# Patient Record
Sex: Female | Born: 1975 | Hispanic: Yes | Marital: Married | Smoking: Never smoker
Health system: Southern US, Community
[De-identification: ages and names within clinical notes are randomized; demographics above are authoritative.]

## PROBLEM LIST (undated history)

## (undated) DIAGNOSIS — Q400 Congenital hypertrophic pyloric stenosis: Secondary | ICD-10-CM

## (undated) DIAGNOSIS — N750 Cyst of Bartholin's gland: Secondary | ICD-10-CM

## (undated) DIAGNOSIS — O09529 Supervision of elderly multigravida, unspecified trimester: Secondary | ICD-10-CM

## (undated) DIAGNOSIS — Z8719 Personal history of other diseases of the digestive system: Secondary | ICD-10-CM

## (undated) HISTORY — DX: Supervision of elderly multigravida, unspecified trimester: O09.529

## (undated) HISTORY — PX: PYLOROMYOTOMY: SUR1063

---

## 2004-07-28 ENCOUNTER — Other Ambulatory Visit: Admission: RE | Admit: 2004-07-28 | Discharge: 2004-07-28 | Payer: Self-pay | Admitting: Obstetrics and Gynecology

## 2005-09-12 ENCOUNTER — Other Ambulatory Visit: Admission: RE | Admit: 2005-09-12 | Discharge: 2005-09-12 | Payer: Self-pay | Admitting: Obstetrics and Gynecology

## 2006-10-03 ENCOUNTER — Other Ambulatory Visit: Admission: RE | Admit: 2006-10-03 | Discharge: 2006-10-03 | Payer: Self-pay | Admitting: Obstetrics and Gynecology

## 2007-12-26 ENCOUNTER — Other Ambulatory Visit: Admission: RE | Admit: 2007-12-26 | Discharge: 2007-12-26 | Payer: Self-pay | Admitting: Obstetrics and Gynecology

## 2008-12-22 ENCOUNTER — Other Ambulatory Visit: Admission: RE | Admit: 2008-12-22 | Discharge: 2008-12-22 | Payer: Self-pay | Admitting: Obstetrics and Gynecology

## 2010-02-21 ENCOUNTER — Inpatient Hospital Stay (HOSPITAL_COMMUNITY): Admission: AD | Admit: 2010-02-21 | Discharge: 2010-02-23 | Payer: Self-pay | Admitting: Obstetrics and Gynecology

## 2010-09-25 ENCOUNTER — Other Ambulatory Visit: Admission: RE | Admit: 2010-09-25 | Discharge: 2010-09-25 | Payer: Self-pay | Admitting: Obstetrics and Gynecology

## 2011-03-12 LAB — CBC
MCHC: 34.5 g/dL (ref 30.0–36.0)
Platelets: 247 10*3/uL (ref 150–400)
RDW: 14 % (ref 11.5–15.5)

## 2011-03-12 LAB — RPR: RPR Ser Ql: NONREACTIVE

## 2013-02-25 ENCOUNTER — Other Ambulatory Visit: Payer: Self-pay | Admitting: Obstetrics and Gynecology

## 2013-07-28 ENCOUNTER — Ambulatory Visit: Payer: Self-pay | Admitting: Orthopedic Surgery

## 2013-07-30 ENCOUNTER — Ambulatory Visit (INDEPENDENT_AMBULATORY_CARE_PROVIDER_SITE_OTHER): Payer: BC Managed Care – PPO | Admitting: Orthopedic Surgery

## 2013-07-30 ENCOUNTER — Encounter: Payer: Self-pay | Admitting: Orthopedic Surgery

## 2013-07-30 ENCOUNTER — Ambulatory Visit (INDEPENDENT_AMBULATORY_CARE_PROVIDER_SITE_OTHER): Payer: BC Managed Care – PPO

## 2013-07-30 VITALS — BP 112/82 | Ht 61.5 in | Wt 163.0 lb

## 2013-07-30 DIAGNOSIS — M549 Dorsalgia, unspecified: Secondary | ICD-10-CM
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
## 2013-07-30 NOTE — Patient Instructions (Addendum)
Start PHYS THERAPY   Take ibuprofen as needed

## 2013-08-06 ENCOUNTER — Ambulatory Visit (HOSPITAL_COMMUNITY)
Admission: RE | Admit: 2013-08-06 | Discharge: 2013-08-06 | Disposition: A | Discharge status: Home / Self Care | Payer: BC Managed Care – PPO | Source: Ambulatory Visit | Attending: Orthopedic Surgery | Admitting: Orthopedic Surgery

## 2013-08-06 DIAGNOSIS — M62838 Other muscle spasm: Secondary | ICD-10-CM | POA: Insufficient documentation

## 2013-08-06 DIAGNOSIS — M545 Low back pain, unspecified: Secondary | ICD-10-CM | POA: Insufficient documentation

## 2013-08-06 DIAGNOSIS — IMO0001 Reserved for inherently not codable concepts without codable children: Secondary | ICD-10-CM | POA: Insufficient documentation
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
## 2013-08-10 ENCOUNTER — Ambulatory Visit (HOSPITAL_COMMUNITY)
Admission: RE | Admit: 2013-08-10 | Discharge: 2013-08-10 | Disposition: A | Discharge status: Home / Self Care | Payer: BC Managed Care – PPO | Source: Ambulatory Visit | Attending: Orthopedic Surgery | Admitting: Orthopedic Surgery

## 2013-08-10 NOTE — Progress Notes (Signed)
Physical Therapy Treatment Patient Details  Name: LAVERE SHINSKY MRN: [id number] Date of Birth: [birth year]  Today's Date: 08/10/2013 Time: 0850-0932 PT Time Calculation (min): 42 min Charges: Manual: [id number] TE: 409-811 Estim/Heat: 922-932 Visit#: 2 of 8  Re-eval: 09/05/13    Authorization:    Authorization Time Period:    Authorization Visit#:   of     Subjective: Symptoms/Limitations Symptoms: pt reports that she has completed some of the stretchs   Precautions/Restrictions     Exercise/Treatments Prone Exercises Shoulder Extension: 5 reps (palms down) Upper Extremity Flexion with Stabilization: Flexion;5 reps Other Prone Exercise: Prone on Elbows: Serratus anterior x10 reps Other Prone Exercise: Shoulder abduction x5 reps Stretches Quadruped Mid Back Stretch: 2 reps;20 seconds  Modalities Modalities: Moist Heat;Electrical Stimulation Manual Therapy Manual Therapy: Joint mobilization Joint Mobilization: Grade I-III: Rt transverse process and rib of T7-12 and lumbar spinous process  Moist Heat Therapy Number Minutes Moist Heat: 10 Minutes (back) Museum/gallery exhibitions officer Stimulation Location: thoracic spine Electrical Stimulation Action: IFES x10 minutes  Electrical Stimulation Parameters: Volts 34 Electrical Stimulation Goals: Pain  Physical Therapy Assessment and Plan PT Assessment and Plan Clinical Impression Statement: Pt has considerable improvement in thoracic  and lumbar PA mobility after manual techniques today.  Added stretching and thoracic strengtheing to decrease pain and e-stim w/heat at end of session to decrease pain/spasm cycle.  Pt will benefit from skilled therapeutic intervention in order to improve on the following deficits: Pain;Decreased coordination;Impaired perceived functional ability;Improper body mechanics;Increased muscle spasms;Increased fascial restricitons;Decreased mobility;Impaired flexibility PT Frequency: Min 2X/week PT  Duration: 4 weeks PT Treatment/Interventions: Therapeutic exercise;Functional mobility training;Neuromuscular re-education;Patient/family education;Manual techniques;Modalities PT Plan: Continue with gentle manual techniques with mobs to thoracic/rib and lumbar region with gentle STM if needed.  COntinue with stretching exercises (add piriformis and hip flexor stretching, heel and toe roll outs), teach core strengthening (ab set, bent knee raise, multifidus).  Progress to therabands and demonstrate proper lifting techique.     Goals Home Exercise Program Pt/caregiver will Perform Home Exercise Program: Independently PT Goal: Perform Home Exercise Program - Progress: Progressing toward goal PT Short Term Goals Time to Complete Short Term Goals: 2 weeks PT Short Term Goal 1: Pt will report 2/10 pain at end range with lumbar motions.  PT Short Term Goal 1 - Progress: Progressing toward goal PT Short Term Goal 2: Pt will tolerate sitting for greater than 90 minutes to continue with work activities with less pain.  PT Short Term Goal 2 - Progress: Progressing toward goal PT Short Term Goal 3: Pt will present with minimal muscle spasms and fascial restrictions to thoracolumbar region for improved flexibility.  PT Short Term Goal 3 - Progress: Progressing toward goal PT Short Term Goal 4: Pt will be educated on a proper lifting techniques.  PT Short Term Goal 4 - Progress: Progressing toward goal PT Long Term Goals Time to Complete Long Term Goals: 4 weeks PT Long Term Goal 1: Pt will be independent with core coordination in order to sit and stand with approprriate posture to decreae risk of secondary injury.  PT Long Term Goal 1 - Progress: Progressing toward goal PT Long Term Goal 2: Pt will improve her body awareness and lift with approprriate mechanics to decrease risk of  injury.  PT Long Term Goal 2 - Progress: Progressing toward goal Long Term Goal 3: Pt will improve her ODI to less than 20%  for improved percieved functional ability.  Long Term Goal 3 Progress: Progressing toward goal  Problem List Patient Active Problem List   Diagnosis Date Noted  . Low back pain 08/06/2013    PT - End of Session Activity Tolerance: Patient tolerated treatment well General Behavior During Therapy: [facility] for tasks assessed/performed PT Plan of Care PT Patient Instructions: enocuraged HEP with playing with her son.  Consulted and Agree with Plan of Care: Patient  GP    Jarad Barth, MPT, ATC 08/10/2013, 9:27 AM

## 2013-08-13 ENCOUNTER — Ambulatory Visit (HOSPITAL_COMMUNITY): Payer: BC Managed Care – PPO

## 2013-08-14 ENCOUNTER — Ambulatory Visit (HOSPITAL_COMMUNITY)
Admission: RE | Admit: 2013-08-14 | Discharge: 2013-08-14 | Disposition: A | Discharge status: Home / Self Care | Payer: BC Managed Care – PPO | Source: Ambulatory Visit | Attending: Orthopedic Surgery | Admitting: Orthopedic Surgery

## 2013-08-14 NOTE — Progress Notes (Signed)
Physical Therapy Treatment Patient Details  Name: Gabriella Dalton MRN: [id number] Date of Birth: February 12, 1976  Today's Date: 08/14/2013 Time: 4098-1191 PT Time Calculation (min): 45 min Charge: TE [id number], Massage 1140-1150  Visit#: 3 of 8  Re-eval: 09/05/13 Assessment Diagnosis: low back pain Surgical Date: 07/01/13 Next MD Visit: Dr. Romeo Apple - unscheduled  Subjective: Symptoms/Limitations Symptoms: Pt repoted compliance with HEP Pain Assessment Currently in Pain?: Yes Pain Score: 5  Pain Location: Back Pain Orientation: Right  Objective:   Exercise/Treatments Prone Exercises Shoulder Extension: 10 reps (palms down) Upper Extremity Flexion with Stabilization: Flexion;5 reps Other Prone Exercise: Shoulder abduction x5 reps, horizontal abduction Stretches Quadruped Mid Back Stretch: 2 reps;20 seconds;1 rep;30 seconds;Limitations Quadruped Mid Back Stretch Limitations: 1x 30" R and L Piriformis Stretch: 2 reps;30 seconds Seated Other Seated Lumbar Exercises: multifidus on mat and theraball 5x 10" each Prone  Other Prone Lumbar Exercises: multifidus 5x 10" with tactile cueing   Manual Therapy Manual Therapy: Massage Massage: Thoracic and lumbar region  Physical Therapy Assessment and Plan PT Assessment and Plan Clinical Impression Statement: Instructed multifidus exercises for core stabilty strengthening with multimodal cueing required, pt with improved technique with seated on ball compared to prone.  Pt given worksheet to add this exercsise to HEP.  Soft tissue massage complete to reduce tightness and pain to thoracic and lumbar region, able to reduce 100%.  Continued quadratus lumborum stretching to improve flexibilty, added piriformis stretch.  Pt reported pain free at end of session. PT Plan: Continue with gentle manual techniques with mobs to thoracic/rib and lumbar region with gentle STM if needed.  COntinue with stretching exercises (continue quadratus and  piriformis stretches, add hip flexor stretching, heel and toe roll outs), teach core strengthening (ab set, bent knee raise, multifidus).  Progress to therabands and demonstrate proper lifting techique.     Goals    Problem List Patient Active Problem List   Diagnosis Date Noted  . Low back pain 08/06/2013    PT - End of Session Activity Tolerance: Patient tolerated treatment well General Behavior During Therapy: [facility] for tasks assessed/performed  GP    Juel Burrow 08/14/2013, 12:07 PM

## 2013-08-18 ENCOUNTER — Ambulatory Visit (HOSPITAL_COMMUNITY): Payer: BC Managed Care – PPO | Admitting: Physical Therapy

## 2013-08-20 ENCOUNTER — Ambulatory Visit (HOSPITAL_COMMUNITY): Payer: BC Managed Care – PPO | Admitting: Physical Therapy

## 2013-08-24 ENCOUNTER — Ambulatory Visit (HOSPITAL_COMMUNITY)
Admission: RE | Admit: 2013-08-24 | Discharge: 2013-08-24 | Disposition: A | Discharge status: Home / Self Care | Payer: BC Managed Care – PPO | Source: Ambulatory Visit | Attending: Family Medicine | Admitting: Family Medicine

## 2013-08-24 DIAGNOSIS — IMO0001 Reserved for inherently not codable concepts without codable children: Secondary | ICD-10-CM | POA: Insufficient documentation

## 2013-08-24 DIAGNOSIS — M545 Low back pain, unspecified: Secondary | ICD-10-CM | POA: Insufficient documentation

## 2013-08-24 DIAGNOSIS — M62838 Other muscle spasm: Secondary | ICD-10-CM | POA: Insufficient documentation

## 2013-08-24 NOTE — Progress Notes (Signed)
Physical Therapy Treatment Patient Details  Name: Gabriella Dalton MRN: [id number] Date of Birth: [birth year]  Today's Date: 08/24/2013 Time: 4098-1191 PT Time Calculation (min): 53 min Charges: TE: 846-900 Manual: 900-929 Estim/Heat: 929-939 Visit#: 4 of 8  Re-eval: 09/05/13 Assessment Diagnosis: low back pain Surgical Date: 07/01/13 Next MD Visit: Dr. Romeo Apple - unscheduled  Authorization:    Authorization Time Period:    Authorization Visit#:   of     Subjective: Symptoms/Limitations Symptoms: Pt reports she has been doing well with less pain.  Had some minor pain on Saturday and was able to use her husband to help massage and decreased pain.  Pain Assessment Currently in Pain?: Yes Pain Score: 2  Pain Location: Back Pain Orientation: Right  Precautions/Restrictions     Exercise/Treatments Standing Scapular Retraction: Both;15 reps;Theraband Theraband Level (Scapular Retraction): Level 2 (Red) Row: Both;15 reps;Theraband Theraband Level (Row): Level 2 (Red) Shoulder Extension: Both;15 reps;Theraband Theraband Level (Shoulder Extension): Level 2 (Red) Seated Other Seated Lumbar Exercises: Quadratus Stretch: 3x30 sec holds Supine Ab Set: Limitations AB Set Limitations: 7x10 sec holds w/PT faciliation Clam: 5 reps;Limitations Clam Limitations: w/ab set alternating BLE Bent Knee Raise: 5 reps;Limitations Bent Knee Raise Limitations: w/ab set alternating BLE Prone  Opposite Arm/Leg Raise: Right arm/Left leg;Left arm/Right leg;5 reps Other Prone Lumbar Exercises: 6+  Modalities Modalities: Moist Heat;Electrical Stimulation Manual Therapy Manual Therapy: Myofascial release Myofascial Release: Rt thoracolumbar region to decrease fascial restrictions.  Moist Heat Therapy Number Minutes Moist Heat: 10 Minutes Electrical Stimulation Electrical Stimulation Location: thoracic spine Electrical Stimulation Action: IFES x10 minutes  Electrical Stimulation Goals:  Pain  Physical Therapy Assessment and Plan PT Assessment and Plan Clinical Impression Statement: Instructed pt in T-band exercieses to improve lumbar strength and posture.  Provided pt with red t-band and instructions.  Pt demonstrates minimal fascial restrictions at the end of therapy today.  PT Plan: Continue with gentle manual techniques with mobs to thoracic/rib and lumbar region with gentle STM if needed.  COntinue with stretching exercises (continue quadratus and piriformis stretches, add hip flexor stretching, heel and toe roll outs), teach core strengthening (ab set, bent knee raise, multifidus).  Progress to therabands and demonstrate proper lifting techique.     Goals Home Exercise Program Pt/caregiver will Perform Home Exercise Program: Independently PT Goal: Perform Home Exercise Program - Progress: Met PT Short Term Goals Time to Complete Short Term Goals: 2 weeks PT Short Term Goal 1: Pt will report 2/10 pain at end range with lumbar motions.  PT Short Term Goal 1 - Progress: Met PT Short Term Goal 2: Pt will tolerate sitting for greater than 90 minutes to continue with work activities with less pain.  PT Short Term Goal 2 - Progress: Met PT Short Term Goal 3: Pt will present with minimal muscle spasms and fascial restrictions to thoracolumbar region for improved flexibility.  PT Short Term Goal 3 - Progress: Progressing toward goal PT Short Term Goal 4: Pt will be educated on a proper lifting techniques.  PT Long Term Goals Time to Complete Long Term Goals: 4 weeks PT Long Term Goal 1: Pt will be independent with core coordination in order to sit and stand with approprriate posture to decreae risk of secondary injury.  PT Long Term Goal 2: Pt will improve her body awareness and lift with approprriate mechanics to decrease risk of  injury.  Long Term Goal 3: Pt will improve her ODI to less than 20% for improved percieved functional ability.   Problem  List Patient Active  Problem List   Diagnosis Date Noted  . Low back pain 08/06/2013    PT - End of Session Activity Tolerance: Patient tolerated treatment well General Behavior During Therapy: WFL for tasks assessed/performed PT Plan of Care PT Home Exercise Plan: provided red t-band and exercises.  Consulted and Agree with Plan of Care: Patient  GP    Azyria Osmon 08/24/2013, 9:33 AM

## 2013-08-27 ENCOUNTER — Ambulatory Visit (HOSPITAL_COMMUNITY)
Admission: RE | Admit: 2013-08-27 | Discharge: 2013-08-27 | Disposition: A | Discharge status: Home / Self Care | Payer: BC Managed Care – PPO | Source: Ambulatory Visit | Attending: Family Medicine | Admitting: Family Medicine
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
## 2013-10-22 ENCOUNTER — Other Ambulatory Visit: Payer: Self-pay

## 2013-12-17 DIAGNOSIS — Z8719 Personal history of other diseases of the digestive system: Secondary | ICD-10-CM

## 2013-12-17 HISTORY — DX: Personal history of other diseases of the digestive system: Z87.19

## 2014-05-17 ENCOUNTER — Other Ambulatory Visit (HOSPITAL_COMMUNITY): Payer: Self-pay | Admitting: Family Medicine

## 2014-05-17 ENCOUNTER — Ambulatory Visit (HOSPITAL_COMMUNITY)
Admission: RE | Admit: 2014-05-17 | Discharge: 2014-05-17 | Disposition: A | Discharge status: Home / Self Care | Payer: BC Managed Care – PPO | Source: Ambulatory Visit | Attending: Family Medicine | Admitting: Family Medicine

## 2014-05-17 DIAGNOSIS — K8020 Calculus of gallbladder without cholecystitis without obstruction: Secondary | ICD-10-CM

## 2014-05-17 DIAGNOSIS — R1011 Right upper quadrant pain: Secondary | ICD-10-CM

## 2014-05-18 ENCOUNTER — Encounter (HOSPITAL_COMMUNITY)
Admission: RE | Admit: 2014-05-18 | Discharge: 2014-05-18 | Disposition: A | Discharge status: Home / Self Care | Payer: BC Managed Care – PPO | Source: Ambulatory Visit | Attending: General Surgery | Admitting: General Surgery

## 2014-05-18 ENCOUNTER — Encounter (HOSPITAL_COMMUNITY): Payer: Self-pay

## 2014-05-18 HISTORY — DX: Congenital hypertrophic pyloric stenosis: Q40.0

## 2014-05-18 NOTE — Patient Instructions (Signed)
Gabriella Dalton  05/18/2014   Your procedure is scheduled on:  05/19/14  [facility] to [facility] at 10:15 AM.  Call this number if you have problems the morning of surgery: [phone]   Remember:   Do not eat food or drink liquids after midnight.   Take these medicines the morning of surgery with A SIP OF WATER: None   Do not wear jewelry, make-up or nail polish.  Do not wear lotions, powders, or perfumes. You may wear deodorant.  Do not shave 48 hours prior to surgery. Men may shave face and neck.  Do not bring valuables to the hospital.  [facility] is not responsible for any belongings or valuables.               Contacts, dentures or bridgework may not be worn into surgery.  Leave suitcase in the car. After surgery it may be brought to your room.  For patients admitted to the hospital, discharge time is determined by your treatment team.               Patients discharged the day of surgery will not be allowed to drive home.  Name and phone number of your driver:   Special Instructions: Shower using CHG 2 nights before surgery and the night before surgery.  If you shower the day of surgery use CHG.  Use special wash - you have one bottle of CHG for all showers.  You should use approximately 1/3 of the bottle for each shower.   Please read over the following fact sheets that you were given: Anesthesia Post-op Instructions   PATIENT INSTRUCTIONS POST-ANESTHESIA  IMMEDIATELY FOLLOWING SURGERY:  Do not drive or operate machinery for the first twenty four hours after surgery.  Do not make any important decisions for twenty four hours after surgery or while taking narcotic pain medications or sedatives.  If you develop intractable nausea and vomiting or a severe headache please notify your doctor immediately.  FOLLOW-UP:  Please make an appointment with your surgeon as instructed. You do not need to follow up with anesthesia unless specifically instructed to do so.  WOUND CARE  INSTRUCTIONS (if applicable):  Keep a dry clean dressing on the anesthesia/puncture wound site if there is drainage.  Once the wound has quit draining you may leave it open to air.  Generally you should leave the bandage intact for twenty four hours unless there is drainage.  If the epidural site drains for more than 36-48 hours please call the anesthesia department.  QUESTIONS?:  Please feel free to call your physician or the hospital operator if you have any questions, and they will be happy to assist you.      Laparoscopic Cholecystectomy Laparoscopic cholecystectomy is surgery to remove the gallbladder. The gallbladder is located in the upper right part of the abdomen, behind the liver. It is a storage sac for bile produced in the liver. Bile aids in the digestion and absorption of fats. Cholecystectomy is often done for inflammation of the gallbladder (cholecystitis). This condition is usually caused by a buildup of gallstones (cholelithiasis) in your gallbladder. Gallstones can block the flow of bile, resulting in inflammation and pain. In severe cases, emergency surgery may be required. When emergency surgery is not required, you will have time to prepare for the procedure. Laparoscopic surgery is an alternative to open surgery. Laparoscopic surgery has a shorter recovery time. Your common bile duct may also need to be examined during the procedure. If stones  are found in the common bile duct, they may be removed. LET [facility] CARE PROVIDER KNOW ABOUT:  Any allergies you have.  All medicines you are taking, including vitamins, herbs, eye drops, creams, and over-the-counter medicines.  Previous problems you or members of your family have had with the use of anesthetics.  Any blood disorders you have.  Previous surgeries you have had.  Medical conditions you have. RISKS AND COMPLICATIONS Generally, this is a safe procedure. However, as with any procedure, complications can occur.  Possible complications include:  Infection.  Damage to the common bile duct, nerves, arteries, veins, or other internal organs such as the stomach, liver, or intestines.  Bleeding.  A stone may remain in the common bile duct.  A bile leak from the cyst duct that is clipped when your gallbladder is removed.  The need to convert to open surgery, which requires a larger incision in the abdomen. This may be necessary if your surgeon thinks it is not safe to continue with a laparoscopic procedure. BEFORE THE PROCEDURE  Ask your health care provider about changing or stopping any regular medicines. You will need to stop taking aspirin or blood thinners at least 5 days prior to surgery.  Do not eat or drink anything after midnight the night before surgery.  Let your health care provider know if you develop a cold or other infectious problem before surgery. PROCEDURE   You will be given medicine to make you sleep through the procedure (general anesthetic). A breathing tube will be placed in your mouth.  When you are asleep, your surgeon will make several small cuts (incisions) in your abdomen.  A thin, lighted tube with a tiny camera on the end (laparoscope) is inserted through one of the small incisions. The camera on the laparoscope sends a picture to a TV screen in the operating room. This gives the surgeon a good view inside your abdomen.  A gas will be pumped into your abdomen. This expands your abdomen so that the surgeon has more room to perform the surgery.  Other tools needed for the procedure are inserted through the other incisions. The gallbladder is removed through one of the incisions.  After the removal of your gallbladder, the incisions will be closed with stitches, staples, or skin glue. AFTER THE PROCEDURE  You will be taken to a recovery area where your progress will be checked often.  You may be allowed to go home the same day if your pain is controlled and you can  tolerate liquids. Document Released: 12/03/2005 Document Revised: 09/23/2013 Document Reviewed: 07/15/2013 [facility] Patient Information 2014 [place], [place].

## 2014-05-19 ENCOUNTER — Ambulatory Visit (HOSPITAL_COMMUNITY): Payer: BC Managed Care – PPO | Admitting: Anesthesiology

## 2014-05-19 ENCOUNTER — Encounter (HOSPITAL_COMMUNITY)
Admission: RE | Disposition: A | Discharge status: Home / Self Care | Payer: Self-pay | Source: Ambulatory Visit | Attending: General Surgery

## 2014-05-19 ENCOUNTER — Encounter (HOSPITAL_COMMUNITY): Payer: Self-pay | Admitting: *Deleted

## 2014-05-19 ENCOUNTER — Ambulatory Visit (HOSPITAL_COMMUNITY)
Admission: RE | Admit: 2014-05-19 | Discharge: 2014-05-19 | Disposition: A | Discharge status: Home / Self Care | Payer: BC Managed Care – PPO | Source: Ambulatory Visit | Attending: General Surgery | Admitting: General Surgery

## 2014-05-19 ENCOUNTER — Encounter (HOSPITAL_COMMUNITY): Payer: BC Managed Care – PPO | Admitting: Anesthesiology

## 2014-05-19 DIAGNOSIS — K8010 Calculus of gallbladder with chronic cholecystitis without obstruction: Secondary | ICD-10-CM | POA: Insufficient documentation

## 2014-05-19 DIAGNOSIS — K821 Hydrops of gallbladder: Secondary | ICD-10-CM | POA: Insufficient documentation

## 2014-05-19 DIAGNOSIS — K8000 Calculus of gallbladder with acute cholecystitis without obstruction: Secondary | ICD-10-CM | POA: Insufficient documentation

## 2014-05-19 DIAGNOSIS — O9989 Other specified diseases and conditions complicating pregnancy, childbirth and the puerperium: Secondary | ICD-10-CM | POA: Insufficient documentation

## 2014-05-19 HISTORY — PX: CHOLECYSTECTOMY: SHX55

## 2014-05-19 SURGERY — LAPAROSCOPIC CHOLECYSTECTOMY
Anesthesia: General | Site: Abdomen

## 2014-05-19 MED ORDER — FENTANYL CITRATE 0.05 MG/ML IJ SOLN
INTRAMUSCULAR | Status: AC
  Filled 2014-05-19: qty 2

## 2014-05-19 MED ORDER — FENTANYL CITRATE 0.05 MG/ML IJ SOLN
25.0000 ug | INTRAMUSCULAR | Status: DC | PRN
  Administered 2014-05-19 (×4): 50 ug via INTRAVENOUS

## 2014-05-19 MED ORDER — CEFAZOLIN SODIUM-DEXTROSE 2-3 GM-% IV SOLR
2.0000 g | INTRAVENOUS | Status: AC
  Administered 2014-05-19: 2 g via INTRAVENOUS

## 2014-05-19 MED ORDER — LACTATED RINGERS IV SOLN
INTRAVENOUS | Status: DC
  Administered 2014-05-19: 1000 mL via INTRAVENOUS

## 2014-05-19 MED ORDER — POVIDONE-IODINE 10 % OINT PACKET
TOPICAL_OINTMENT | CUTANEOUS | Status: DC | PRN
  Administered 2014-05-19: 1 via TOPICAL

## 2014-05-19 MED ORDER — GLYCOPYRROLATE 0.2 MG/ML IJ SOLN
INTRAMUSCULAR | Status: AC
  Filled 2014-05-19: qty 2

## 2014-05-19 MED ORDER — CEFAZOLIN SODIUM 1-5 GM-% IV SOLN
INTRAVENOUS | Status: AC
  Filled 2014-05-19: qty 100

## 2014-05-19 MED ORDER — LIDOCAINE HCL (CARDIAC) 20 MG/ML IV SOLN
INTRAVENOUS | Status: DC | PRN
  Administered 2014-05-19: 25 mg via INTRAVENOUS

## 2014-05-19 MED ORDER — ROCURONIUM BROMIDE 50 MG/5ML IV SOLN
INTRAVENOUS | Status: AC
  Filled 2014-05-19: qty 1

## 2014-05-19 MED ORDER — CHLORHEXIDINE GLUCONATE 4 % EX LIQD: 1.0000 "application " | Freq: Once | CUTANEOUS | Status: DC

## 2014-05-19 MED ORDER — MIDAZOLAM HCL 2 MG/2ML IJ SOLN
INTRAMUSCULAR | Status: AC
  Filled 2014-05-19: qty 2

## 2014-05-19 MED ORDER — SODIUM CHLORIDE 0.9 % IR SOLN
Status: DC | PRN
  Administered 2014-05-19: 1000 mL

## 2014-05-19 MED ORDER — BUPIVACAINE HCL (PF) 0.5 % IJ SOLN
INTRAMUSCULAR | Status: DC | PRN
  Administered 2014-05-19: 10 mL

## 2014-05-19 MED ORDER — PROPOFOL 10 MG/ML IV BOLUS
INTRAVENOUS | Status: DC | PRN
  Administered 2014-05-19: 150 mg via INTRAVENOUS

## 2014-05-19 MED ORDER — GLYCOPYRROLATE 0.2 MG/ML IJ SOLN
INTRAMUSCULAR | Status: DC | PRN
  Administered 2014-05-19: 0.4 mg via INTRAVENOUS

## 2014-05-19 MED ORDER — NEOSTIGMINE METHYLSULFATE 10 MG/10ML IV SOLN
INTRAVENOUS | Status: DC | PRN
  Administered 2014-05-19: 1 mg via INTRAVENOUS

## 2014-05-19 MED ORDER — GLYCOPYRROLATE 0.2 MG/ML IJ SOLN
INTRAMUSCULAR | Status: AC
  Filled 2014-05-19: qty 1

## 2014-05-19 MED ORDER — BUPIVACAINE HCL (PF) 0.5 % IJ SOLN
INTRAMUSCULAR | Status: AC
  Filled 2014-05-19: qty 30

## 2014-05-19 MED ORDER — ONDANSETRON HCL 4 MG/2ML IJ SOLN
INTRAMUSCULAR | Status: AC
  Filled 2014-05-19: qty 2

## 2014-05-19 MED ORDER — POVIDONE-IODINE 10 % EX OINT
TOPICAL_OINTMENT | CUTANEOUS | Status: AC
  Filled 2014-05-19: qty 1

## 2014-05-19 MED ORDER — MIDAZOLAM HCL 2 MG/2ML IJ SOLN
1.0000 mg | INTRAMUSCULAR | Status: DC | PRN
  Administered 2014-05-19 (×2): 2 mg via INTRAVENOUS
  Filled 2014-05-19: qty 2

## 2014-05-19 MED ORDER — PROPOFOL 10 MG/ML IV EMUL
INTRAVENOUS | Status: AC
  Filled 2014-05-19: qty 20

## 2014-05-19 MED ORDER — OXYCODONE-ACETAMINOPHEN 7.5-325 MG PO TABS: 1.0000 | ORAL_TABLET | ORAL | Status: DC | PRN

## 2014-05-19 MED ORDER — FENTANYL CITRATE 0.05 MG/ML IJ SOLN
INTRAMUSCULAR | Status: AC
  Filled 2014-05-19: qty 5

## 2014-05-19 MED ORDER — ONDANSETRON HCL 4 MG/2ML IJ SOLN: 4.0000 mg | Freq: Once | INTRAMUSCULAR | Status: DC | PRN

## 2014-05-19 MED ORDER — HEMOSTATIC AGENTS (NO CHARGE) OPTIME
TOPICAL | Status: DC | PRN
  Administered 2014-05-19 (×3): 1 via TOPICAL

## 2014-05-19 MED ORDER — ROCURONIUM BROMIDE 100 MG/10ML IV SOLN
INTRAVENOUS | Status: DC | PRN
  Administered 2014-05-19: 35 mg via INTRAVENOUS

## 2014-05-19 MED ORDER — FENTANYL CITRATE 0.05 MG/ML IJ SOLN
INTRAMUSCULAR | Status: DC | PRN
  Administered 2014-05-19 (×3): 50 ug via INTRAVENOUS
  Administered 2014-05-19: 100 ug via INTRAVENOUS

## 2014-05-19 MED ORDER — LACTATED RINGERS IV SOLN
INTRAVENOUS | Status: DC | PRN
  Administered 2014-05-19: 11:00:00 via INTRAVENOUS

## 2014-05-19 MED ORDER — ONDANSETRON HCL 4 MG/2ML IJ SOLN
4.0000 mg | Freq: Once | INTRAMUSCULAR | Status: AC
  Administered 2014-05-19: 4 mg via INTRAVENOUS

## 2014-05-19 MED ORDER — GLYCOPYRROLATE 0.2 MG/ML IJ SOLN
0.2000 mg | Freq: Once | INTRAMUSCULAR | Status: AC
  Administered 2014-05-19: 0.2 mg via INTRAVENOUS

## 2014-05-19 SURGICAL SUPPLY — 43 items
APPLIER CLIP LAPSCP 10X32 DD (CLIP) ×3 IMPLANT
BAG HAMPER (MISCELLANEOUS) ×3 IMPLANT
BAG SPEC RTRVL LRG 6X4 10 (ENDOMECHANICALS) ×1
BLADE 11 SAFETY STRL DISP (BLADE) ×3 IMPLANT
CLOTH BEACON ORANGE TIMEOUT ST (SAFETY) ×3 IMPLANT
COVER LIGHT HANDLE STERIS (MISCELLANEOUS) ×6 IMPLANT
DECANTER SPIKE VIAL GLASS SM (MISCELLANEOUS) ×3 IMPLANT
DURAPREP 26ML APPLICATOR (WOUND CARE) ×3 IMPLANT
ELECT REM PT RETURN 9FT ADLT (ELECTROSURGICAL) ×3
ELECTRODE REM PT RTRN 9FT ADLT (ELECTROSURGICAL) ×1 IMPLANT
FILTER SMOKE EVAC LAPAROSHD (FILTER) ×3 IMPLANT
FORMALIN 10 PREFIL 120ML (MISCELLANEOUS) ×3 IMPLANT
GLOVE BIOGEL PI IND STRL 7.0 (GLOVE) ×2 IMPLANT
GLOVE BIOGEL PI INDICATOR 7.0 (GLOVE) ×4
GLOVE ECLIPSE 6.5 STRL STRAW (GLOVE) ×3 IMPLANT
GLOVE SS BIOGEL STRL SZ 6.5 (GLOVE) ×1 IMPLANT
GLOVE SUPERSENSE BIOGEL SZ 6.5 (GLOVE) ×2
GLOVE SURG SS PI 7.5 STRL IVOR (GLOVE) ×3 IMPLANT
GOWN STRL REUS W/TWL LRG LVL3 (GOWN DISPOSABLE) ×9 IMPLANT
HEMOSTAT SNOW SURGICEL 2X4 (HEMOSTASIS) ×9 IMPLANT
INST SET LAPROSCOPIC AP (KITS) ×3 IMPLANT
IV NS IRRIG 3000ML ARTHROMATIC (IV SOLUTION) IMPLANT
KIT ROOM TURNOVER APOR (KITS) ×3 IMPLANT
MANIFOLD NEPTUNE II (INSTRUMENTS) ×3 IMPLANT
NEEDLE INSUFFLATION 14GA 120MM (NEEDLE) ×3 IMPLANT
NS IRRIG 1000ML POUR BTL (IV SOLUTION) ×3 IMPLANT
PACK LAP CHOLE LZT030E (CUSTOM PROCEDURE TRAY) ×3 IMPLANT
PAD ARMBOARD 7.5X6 YLW CONV (MISCELLANEOUS) ×3 IMPLANT
POUCH SPECIMEN RETRIEVAL 10MM (ENDOMECHANICALS) ×3 IMPLANT
SET BASIN LINEN APH (SET/KITS/TRAYS/PACK) ×3 IMPLANT
SET TUBE IRRIG SUCTION NO TIP (IRRIGATION / IRRIGATOR) IMPLANT
SLEEVE ENDOPATH XCEL 5M (ENDOMECHANICALS) ×3 IMPLANT
SPONGE GAUZE 2X2 8PLY STER LF (GAUZE/BANDAGES/DRESSINGS) ×4
SPONGE GAUZE 2X2 8PLY STRL LF (GAUZE/BANDAGES/DRESSINGS) ×8 IMPLANT
STAPLER VISISTAT (STAPLE) ×3 IMPLANT
SUT VICRYL 0 UR6 27IN ABS (SUTURE) ×3 IMPLANT
TAPE CLOTH SURG 4X10 WHT LF (GAUZE/BANDAGES/DRESSINGS) ×3 IMPLANT
TROCAR ENDO BLADELESS 11MM (ENDOMECHANICALS) ×3 IMPLANT
TROCAR XCEL NON-BLD 5MMX100MML (ENDOMECHANICALS) ×3 IMPLANT
TROCAR XCEL UNIV SLVE 11M 100M (ENDOMECHANICALS) ×3 IMPLANT
TUBING INSUFFLATION (TUBING) ×3 IMPLANT
WARMER LAPAROSCOPE (MISCELLANEOUS) ×3 IMPLANT
YANKAUER SUCT 12FT TUBE ARGYLE (SUCTIONS) ×3 IMPLANT

## 2014-05-19 NOTE — H&P (Signed)
  NTS SOAP Note  Vital Signs:  Vitals as of: 05/18/2014: Systolic 128: Diastolic 79: Heart Rate 60: Temp 98.86F: Height 74ft 2in: Weight 174Lbs 0 Ounces: Pain Level 10: BMI 31.82  BMI : 31.82 kg/m2  Subjective: This 38 Years 58 Months old Female presents for of gallstones.  Has been having right upper quadrant pain with radiation to the right flank, nausea, and food intolerance for a week now.  U/S of gallbladder shows cholelithiasis, normal common bile duct.  LTF's negative.  Found out also she is five weeks pregnant.  Discussed findings with her OB who states she is ok to undergo surgery.  No fever, chills, jaundice.  Review of Symptoms:  Constitutional:unremarkable   Head:unremarkable    Eyes:unremarkable   Nose/Mouth/Throat:unremarkable Cardiovascular:  unremarkable   Respiratory:unremarkable   Gastrointestin    abdominal pain Genitourinary:unremarkable     Musculoskeletal:unremarkable   Skin:unremarkable Hematolgic/Lymphatic:unremarkable     Allergic/Immunologic:unremarkable     Past Medical History:    Reviewed  Past Medical History  Surgical History: none Medical Problems: none Allergies: nkda Medications: none   Social History:Reviewed  Social History  Preferred Language: English Race:  Other Ethnicity: Not Hispanic / Latino Age: 38 Years 8 Months Marital Status:  M Alcohol: no   Smoking Status: Never smoker reviewed on 05/18/2014 Functional Status reviewed on 05/18/2014 ------------------------------------------------ Bathing: Normal Cooking: Normal Dressing: Normal Driving: Normal Eating: Normal Managing Meds: Normal Oral Care: Normal Shopping: Normal Toileting: Normal Transferring: Normal Walking: Normal Cognitive Status reviewed on 05/18/2014 ------------------------------------------------ Attention: Normal Decision Making: Normal Language: Normal Memory: Normal Motor: Normal Perception:  Normal Problem Solving: Normal Visual and Spatial: Normal   Family History:  Reviewed  Family Health History Mother, Living; Diabetes mellitus, unspecified type;  Father, Living; Cancer unspecified;     Objective Information: General:  Well appearing, well nourished in no distress.   no scleral icterus Heart:  RRR, no murmur or gallop.  Normal S1, S2.  No S3, S4.  Lungs:    CTA bilaterally, no wheezes, rhonchi, rales.  Breathing unlabored. Abdomen:Soft, tender in right upper quadrant to palpation, ND, normal bowel sounds, no HSM, no masses.  No peritoneal signs.  Assessment:Cholelithiasis, biliary colic, pregnant  Diagnoses: 574.00 Calculus of gallbladder with acute cholecystitis (Calculus of gallbladder with acute cholecystitis without obstruction)  Procedures: 49324 - OFFICE OUTPATIENT NEW 30 MINUTES    Plan:  Scheduled for laparoscopic cholecystectomy on 05/19/14.  Patient knows she is at increased risk for miscarriage due to need for GET.   Patient Education:Alternative treatments to surgery were discussed with patient (and family).  Risks and benefits  of procedure including bleeding, infection, hepatobiliary injury, and the possibility of an open procedure were fully explained to the patient (and family) who gave informed consent. Patient/family questions were addressed.  Follow-up:Pending Surgery

## 2014-05-19 NOTE — Op Note (Signed)
Patient:  Gabriella Dalton  DOB:  06-Jan-1976  MRN:  [id number]   Preop Diagnosis:  Cholecystitis, cholelithiasis  Postop Diagnosis:  Same  Procedure:  Laparoscopic cholecystectomy  Surgeon:  Franky Macho, M.D.  Anes:  General endotracheal  Indications:  Patient is a 38 year old Hispanic female who presents with cholecystitis secondary to cholelithiasis. The risks and benefits of the procedure including bleeding, infection, hepatobiliary, and the possibility of an open procedure were fully explained to the patient, who gave informed consent.  Procedure note:  The patient is placed the supine position. After induction of general endotracheal anesthesia, the abdomen was prepped and draped using usual sterile technique with DuraPrep. Surgical site confirmation was performed.  A supraumbilical incision was made down to the fascia. A Veress needle was introduced into the abdominal cavity and confirmation of placement was done using the saline drop test. The abdomen was then insufflated to 16 mm mercury pressure. An 11 mm trocar was introduced into the abdominal cavity under direct visualization without difficulty. The patient was placed in reverse Trendelenburg position and additional 11 mm trocar was placed the epigastric region and 5 mm trochars were placed the right upper quadrant right flank regions. Liver was inspected and noted to be within normal limits. The gallbladder was moderately distended with a thickened gallbladder wall. It was decompressed using suction. Hydrops of the gallbladder was found. The gallbladder was then retracted in a dynamic fashion in order to expose the triangle of Calot. The cystic duct was first identified. Its junction to the infundibulum was fully identified. Endoclips placed proximally distally on the cystic duct, and the cystic duct was divided. This was likewise done cystic artery. The gallbladder was then freed away from the gallbladder fossa using Bovie  electrocautery. The gallbladder was delivered through the epigastric trocar site using an Endo Catch bag. The gallbladder fossa was inspected and no abnormal bleeding or bile leakage was noted. Surgicel is placed the gallbladder fossa. All fluid and air were then evacuated from the abdominal cavity prior to removal of the trochars.  All wounds were irrigated with normal saline. All wounds were injected with 0.5% Sensorcaine. The supraumbilical fascia as well as epigastric fascia reapproximated using 0 Vicryl interrupted sutures. All skin incisions were closed using staples. Betadine ointment and dry sterile dressings were applied.  All tape and needle counts were correct at the end of the procedure. Patient was extubated in the operating room and transferred to PACU in stable condition.  Complications:  None  EBL:  Minimal  Specimen:  Gallbladder

## 2014-05-19 NOTE — Transfer of Care (Signed)
Immediate Anesthesia Transfer of Care Note  Patient: Gabriella Dalton  Procedure(s) Performed: Procedure(s): LAPAROSCOPIC CHOLECYSTECTOMY (N/A)  Patient Location: PACU  Anesthesia Type:General  Level of Consciousness: awake and patient cooperative  Airway & Oxygen Therapy: Patient Spontanous Breathing and Patient connected to face mask oxygen  Post-op Assessment: Report given to PACU RN, Post -op Vital signs reviewed and stable and Patient moving all extremities  Post vital signs: Reviewed and stable  Complications: No apparent anesthesia complications

## 2014-05-19 NOTE — Anesthesia Preprocedure Evaluation (Signed)
Anesthesia Evaluation  Patient identified by MRN, date of birth, ID band Patient awake    Reviewed: Allergy & Precautions, H&P , NPO status , Patient's Chart, lab work & pertinent test results  Airway Mallampati: II TM Distance: >3 FB Neck ROM: Full    Dental  (+) Teeth Intact   Pulmonary neg pulmonary ROS,  breath sounds clear to auscultation        Cardiovascular Rhythm:Regular     Neuro/Psych    GI/Hepatic negative GI ROS,   Endo/Other    Renal/GU      Musculoskeletal   Abdominal   Peds  Hematology   Anesthesia Other Findings   Reproductive/Obstetrics (+) Pregnancy (5 week pregnancy cleared for surgery by OB.)                           Anesthesia Physical Anesthesia Plan  ASA: I  Anesthesia Plan: General   Post-op Pain Management:    Induction: Intravenous  Airway Management Planned: Oral ETT  Additional Equipment:   Intra-op Plan:   Post-operative Plan: Extubation in OR  Informed Consent: I have reviewed the patients History and Physical, chart, labs and discussed the procedure including the risks, benefits and alternatives for the proposed anesthesia with the patient or authorized representative who has indicated his/her understanding and acceptance.     Plan Discussed with:   Anesthesia Plan Comments:         Anesthesia Quick Evaluation

## 2014-05-19 NOTE — Anesthesia Procedure Notes (Signed)
Procedure Name: Intubation Date/Time: 05/19/2014 12:49 PM Performed by: Despina Hidden Pre-anesthesia Checklist: Suction available, Patient being monitored, Emergency Drugs available and Patient identified Patient Re-evaluated:Patient Re-evaluated prior to inductionOxygen Delivery Method: Circle system utilized Preoxygenation: Pre-oxygenation with 100% oxygen Intubation Type: IV induction and Inhalational induction Ventilation: Mask ventilation without difficulty and Oral airway inserted - appropriate to patient size Laryngoscope Size: Mac and 3 Grade View: Grade I Tube type: Oral Number of attempts: 1 Airway Equipment and Method: Stylet Placement Confirmation: ETT inserted through vocal cords under direct vision and breath sounds checked- equal and bilateral Secured at: 22 cm Tube secured with: Tape Dental Injury: Teeth and Oropharynx as per pre-operative assessment

## 2014-05-19 NOTE — Interval H&P Note (Signed)
History and Physical Interval Note:  05/19/2014 12:10 PM  Gabriella Dalton  has presented today for surgery, with the diagnosis of cholelithiasis  The various methods of treatment have been discussed with the patient and family. After consideration of risks, benefits and other options for treatment, the patient has consented to  Procedure(s): LAPAROSCOPIC CHOLECYSTECTOMY (N/A) as a surgical intervention .  The patient's history has been reviewed, patient examined, no change in status, stable for surgery.  I have reviewed the patient's chart and labs.  Questions were answered to the patient's satisfaction.     Dalia Heading

## 2014-05-19 NOTE — Anesthesia Postprocedure Evaluation (Signed)
  Anesthesia Post-op Note  Patient: Gabriella Dalton  Procedure(s) Performed: Procedure(s): LAPAROSCOPIC CHOLECYSTECTOMY (N/A)  Patient Location: PACU  Anesthesia Type:General  Level of Consciousness: awake, alert , oriented and patient cooperative  Airway and Oxygen Therapy: Patient Spontanous Breathing  Post-op Pain: 3 /10, mild  Post-op Assessment: Post-op Vital signs reviewed, Patient's Cardiovascular Status Stable, Respiratory Function Stable, RESPIRATORY FUNCTION UNSTABLE, No signs of Nausea or vomiting and Pain level controlled  Post-op Vital Signs: Reviewed and stable  Last Vitals:  Filed Vitals:   05/19/14 1235  BP: 106/62  Pulse:   Temp:   Resp: 19    Complications: No apparent anesthesia complications

## 2014-05-19 NOTE — Discharge Instructions (Signed)
Laparoscopic Cholecystectomy, Care After °Refer to this sheet in the next few weeks. These instructions provide you with information on caring for yourself after your procedure. Your health care provider may also give you more specific instructions. Your treatment has been planned according to current medical practices, but problems sometimes occur. Call your health care provider if you have any problems or questions after your procedure. °WHAT TO EXPECT AFTER THE PROCEDURE °After your procedure, it is typical to have the following: °· Pain at your incision sites. You will be given pain medicines to control the pain. °· Mild nausea or vomiting. This should improve after the first 24 hours. °· Bloating and possibly shoulder pain from the gas used during the procedure. This will improve after the first 24 hours. °HOME CARE INSTRUCTIONS  °· Change bandages (dressings) as directed by your health care provider. °· Keep the wound dry and clean. You may wash the wound gently with soap and water. Gently blot or dab the area dry. °· Do not take baths or use swimming pools or hot tubs for 2 weeks or until your health care provider approves. °· Only take over-the-counter or prescription medicines as directed by your health care provider. °· Continue your normal diet as directed by your health care provider. °· Do not lift anything heavier than 10 pounds (4.5 kg) until your health care provider approves. °· Do not play contact sports for 1 week or until your health care provider approves. °SEEK MEDICAL CARE IF:  °· You have redness, swelling, or increasing pain in the wound. °· You notice yellowish-white fluid (pus) coming from the wound. °· You have drainage from the wound that lasts longer than 1 day. °· You notice a bad smell coming from the wound or dressing. °· Your surgical cuts (incisions) break open. °SEEK IMMEDIATE MEDICAL CARE IF:  °· You develop a rash. °· You have difficulty breathing. °· You have chest pain. °· You  have a fever. °· You have increasing pain in the shoulders (shoulder strap areas). °· You have dizzy episodes or faint while standing. °· You have severe abdominal pain. °· You feel sick to your stomach (nauseous) or throw up (vomit) and this lasts for more than 1 day. °Document Released: 12/03/2005 Document Revised: 09/23/2013 Document Reviewed: 07/15/2013 °[employer]® Patient Information ©2014 [employer]. ° °

## 2014-05-20 ENCOUNTER — Encounter (HOSPITAL_COMMUNITY): Payer: Self-pay | Admitting: General Surgery

## 2014-05-31 ENCOUNTER — Ambulatory Visit (INDEPENDENT_AMBULATORY_CARE_PROVIDER_SITE_OTHER): Payer: BC Managed Care – PPO | Admitting: General Surgery

## 2014-06-10 LAB — OB RESULTS CONSOLE ABO/RH: RH Type: POSITIVE

## 2014-06-10 LAB — OB RESULTS CONSOLE HIV ANTIBODY (ROUTINE TESTING): HIV: NONREACTIVE

## 2014-06-10 LAB — OB RESULTS CONSOLE RUBELLA ANTIBODY, IGM: Rubella: IMMUNE

## 2014-06-10 LAB — OB RESULTS CONSOLE GC/CHLAMYDIA
Chlamydia: NEGATIVE
Gonorrhea: NEGATIVE

## 2014-06-10 LAB — OB RESULTS CONSOLE ANTIBODY SCREEN: ANTIBODY SCREEN: NEGATIVE

## 2014-06-10 LAB — OB RESULTS CONSOLE RPR: RPR: NONREACTIVE

## 2014-06-10 LAB — OB RESULTS CONSOLE HEPATITIS B SURFACE ANTIGEN: Hepatitis B Surface Ag: NEGATIVE

## 2014-06-17 ENCOUNTER — Emergency Department (HOSPITAL_COMMUNITY)
Admission: EM | Admit: 2014-06-17 | Discharge: 2014-06-17 | Disposition: A | Discharge status: Home / Self Care | Payer: BC Managed Care – PPO | Attending: Emergency Medicine | Admitting: Emergency Medicine

## 2014-06-17 ENCOUNTER — Encounter (HOSPITAL_COMMUNITY): Payer: Self-pay | Admitting: Emergency Medicine

## 2014-06-17 DIAGNOSIS — Q400 Congenital hypertrophic pyloric stenosis: Secondary | ICD-10-CM | POA: Insufficient documentation

## 2014-06-17 DIAGNOSIS — O209 Hemorrhage in early pregnancy, unspecified: Secondary | ICD-10-CM | POA: Insufficient documentation

## 2014-06-17 DIAGNOSIS — Z79899 Other long term (current) drug therapy: Secondary | ICD-10-CM | POA: Insufficient documentation

## 2014-06-17 DIAGNOSIS — Z9089 Acquired absence of other organs: Secondary | ICD-10-CM | POA: Insufficient documentation

## 2014-06-17 LAB — CBC WITH DIFFERENTIAL/PLATELET
Basophils Absolute: 0 10*3/uL (ref 0.0–0.1)
Basophils Relative: 0 % (ref 0–1)
Eosinophils Absolute: 0.2 10*3/uL (ref 0.0–0.7)
Eosinophils Relative: 3 % (ref 0–5)
HCT: 33.4 % — ABNORMAL LOW (ref 36.0–46.0)
Hemoglobin: 11.7 g/dL — ABNORMAL LOW (ref 12.0–15.0)
LYMPHS ABS: 2 10*3/uL (ref 0.7–4.0)
Lymphocytes Relative: 32 % (ref 12–46)
MCH: 32.5 pg (ref 26.0–34.0)
MCHC: 35 g/dL (ref 30.0–36.0)
MCV: 92.8 fL (ref 78.0–100.0)
Monocytes Absolute: 0.4 10*3/uL (ref 0.1–1.0)
Monocytes Relative: 7 % (ref 3–12)
Neutro Abs: 3.5 10*3/uL (ref 1.7–7.7)
Neutrophils Relative %: 58 % (ref 43–77)
PLATELETS: 261 10*3/uL (ref 150–400)
RBC: 3.6 MIL/uL — AB (ref 3.87–5.11)
RDW: 12.1 % (ref 11.5–15.5)
WBC: 6.1 10*3/uL (ref 4.0–10.5)

## 2014-06-17 LAB — WET PREP, GENITAL
CLUE CELLS WET PREP: NONE SEEN
Trich, Wet Prep: NONE SEEN
YEAST WET PREP: NONE SEEN

## 2014-06-17 LAB — BASIC METABOLIC PANEL
Anion gap: 14 (ref 5–15)
BUN: 6 mg/dL (ref 6–23)
CO2: 22 mEq/L (ref 19–32)
CREATININE: 0.53 mg/dL (ref 0.50–1.10)
Calcium: 9.4 mg/dL (ref 8.4–10.5)
Chloride: 100 mEq/L (ref 96–112)
GFR calc Af Amer: >90 mL/min (ref >90)
GLUCOSE: 101 mg/dL — AB (ref 70–99)
Potassium: 4.1 mEq/L (ref 3.7–5.3)
Sodium: 136 mEq/L — ABNORMAL LOW (ref 137–147)

## 2014-06-17 LAB — HCG, QUANTITATIVE, PREGNANCY: hCG, Beta Chain, Quant, S: 132867 m[IU]/mL — ABNORMAL HIGH (ref <5)

## 2014-06-17 NOTE — ED Notes (Addendum)
Patient states she is [redacted] weeks pregnant and passed a large blood clot approximately one hour ago and is continuing to bleed at this time. Denies pain. States "I am at risk for miscarriage because I had my gall bladder removed on June 3.

## 2014-06-17 NOTE — ED Provider Notes (Signed)
CSN: [id number]     Arrival date & time 06/17/14  0253 History   First MD Initiated Contact with Patient 06/17/14 0308     Chief Complaint  Patient presents with  . Vaginal Bleeding with pregnancy       HPI Patient reports she is approximately [redacted] weeks pregnant developed vaginal bleeding this evening without abdominal pain.  She has no lightheadedness.  One week ago she had a pelvic ultrasound in the OB office and reports that everything was good with the baby at that time and a heart rate of 170 was growing a normal location.  Patient is a G2 P1 A0.  She did have a laparoscopic cholecystectomy 5 weeks ago.  She's concerned that she had a miscarriage.  She brought a large blood clot with there was concern that this could represent fetal tissue.  She denies back pain.  No lightheadedness or syncope.  No other complaints  Past Medical History  Diagnosis Date  . Pyloric stenosis, congenital     as an infant   Past Surgical History  Procedure Laterality Date  . Pyloromyotomy      pyloric stenosis as an infant  . Cholecystectomy N/A 05/19/2014    Procedure: LAPAROSCOPIC CHOLECYSTECTOMY;  Surgeon: Dalia HeadingMark A Jenkins, MD;  Location: [facility];  Service: General;  Laterality: N/A;   Family History  Problem Relation Age of Onset  . Diabetes    . Cancer    . Arthritis     History  Substance Use Topics  . Smoking status: Never Smoker   . Smokeless tobacco: Never Used  . Alcohol Use: No   OB History   Grav Para Term Preterm Abortions TAB SAB Ect Mult Living   1              Review of Systems  All other systems reviewed and are negative.     Allergies  Review of patient's allergies indicates no known allergies.  Home Medications   Prior to Admission medications   Medication Sig Start Date End Date Taking? Authorizing Provider  oxyCODONE-acetaminophen (PERCOCET) 7.5-325 MG per tablet Take 1-2 tablets by mouth every 4 (four) hours as needed. 05/19/14   Dalia HeadingMark A Jenkins, MD   BP 129/87   Pulse 78  Temp(Src) 98.7 F (37.1 C) (Oral)  Resp 16  Ht 5\' 2"  (1.575 m)  Wt 162 lb (73.483 kg)  BMI 29.62 kg/m2  SpO2 99%  LMP 04/16/2014 Physical Exam  Nursing note and vitals reviewed. Constitutional: She is oriented to person, place, and time. She appears well-developed and well-nourished. No distress.  HENT:  Head: Normocephalic and atraumatic.  Eyes: EOM are normal.  Neck: Normal range of motion.  Cardiovascular: Normal rate, regular rhythm and normal heart sounds.   Pulmonary/Chest: Effort normal and breath sounds normal.  Abdominal: Soft. She exhibits no distension. There is no tenderness.  Musculoskeletal: Normal range of motion.  Neurological: She is alert and oriented to person, place, and time.  Skin: Skin is warm and dry.  Psychiatric: She has a normal mood and affect. Judgment normal.    ED Course  Procedures (including critical care time)  EMERGENCY DEPARTMENT US PREGNANCY "Study: Limited Ultrasound of the Pelvis" INDICATIONS:Pregnancy(required) and Vaginal bleeding Multiple views of the uterus and pelvic cavity are obtained with a multi-frequency probe. APPROACH:Transabdominal  PERFORMED BY: Myself IMAGES ARCHIVED?: Yes LIMITATIONS: none PREGNANCY FREE FLUID: None PREGNANCY UTERUS FINDINGS:Uterus enlarged and Gestational sac noted ADNEXAL FINDINGS:not evaluated PREGNANCY FINDINGS: Intrauterine gestational sac noted, Fetal  pole present and Fetal heart activity seen INTERPRETATION: Viable intrauterine pregnancy FETAL HEART RATE: 167      Labs Review Labs Reviewed  CBC WITH DIFFERENTIAL - Abnormal; Notable for the following:    RBC 3.60 (*)    Hemoglobin 11.7 (*)    HCT 33.4 (*)    All other components within normal limits  BASIC METABOLIC PANEL - Abnormal; Notable for the following:    Sodium 136 (*)    Glucose, Bld 101 (*)    All other components within normal limits  GC/CHLAMYDIA PROBE AMP  WET PREP, GENITAL  HCG, QUANTITATIVE, PREGNANCY   SURGICAL PATHOLOGY    Imaging Review No results found.   EKG Interpretation None      MDM   Final diagnoses:  Vaginal bleeding in pregnancy, first trimester    No pain. Vitals normal. hgb normal. IUP with fetal heart rate noted on bedside ultrasound.  The patient is scheduled to see her obstetrician for routine followup in 12 hours.  She will keep this appointment.  A vas that she call the office first thing in the morning for additional instructions as she may benefit from official ultrasound in the office prior to OB visit.  No indication for imaging at this time.  No pain.  Low suspicion for heterotopic pregnancy.  Strict return precautions given.  As far as the patient knows from her OB visit and initial hx she has no history of placenta previa.  Sample brought to the hospital sent to pathology    Lyanne CoKevin M Tanganyika Bowlds, MD 06/17/14 [phone]

## 2014-06-17 NOTE — Discharge Instructions (Signed)
Vaginal Bleeding During Pregnancy, First Trimester  A small amount of bleeding (spotting) from the vagina is relatively common in early pregnancy. It usually stops on its own. Various things may cause bleeding or spotting in early pregnancy. Some bleeding may be related to the pregnancy, and some may not. In most cases, the bleeding is normal and is not a problem. However, bleeding can also be a sign of something serious. Be sure to tell your health care provider about any vaginal bleeding right away.  Some possible causes of vaginal bleeding during the first trimester include:  · Infection or inflammation of the cervix.  · Growths (polyps) on the cervix.  · Miscarriage or threatened miscarriage.  · Pregnancy tissue has developed outside of the uterus and in a fallopian tube (tubal pregnancy).  · Tiny cysts have developed in the uterus instead of pregnancy tissue (molar pregnancy).  HOME CARE INSTRUCTIONS   Watch your condition for any changes. The following actions may help to lessen any discomfort you are feeling:  · Follow your health care provider's instructions for limiting your activity. If your health care provider orders bed rest, you may need to stay in bed and only get up to use the bathroom. However, your health care provider may allow you to continue light activity.  · If needed, make plans for someone to help with your regular activities and responsibilities while you are on bed rest.  · Keep track of the number of pads you use each day, how often you change pads, and how soaked (saturated) they are. Write this down.  · Do not use tampons. Do not douche.  · Do not have sexual intercourse or orgasms until approved by your health care provider.  · If you pass any tissue from your vagina, save the tissue so you can show it to your health care provider.  · Only take over-the-counter or prescription medicines as directed by your health care provider.  · Do not take aspirin because it can make you  bleed.  · Keep all follow-up appointments as directed by your health care provider.  SEEK MEDICAL CARE IF:  · You have any vaginal bleeding during any part of your pregnancy.  · You have cramps or labor pains.  · You have a fever, not controlled by medicine.  SEEK IMMEDIATE MEDICAL CARE IF:   · You have severe cramps in your back or belly (abdomen).  · You pass large clots or tissue from your vagina.  · Your bleeding increases.  · You feel light-headed or weak, or you have fainting episodes.  · You have chills.  · You are leaking fluid or have a gush of fluid from your vagina.  · You pass out while having a bowel movement.  MAKE SURE YOU:  · Understand these instructions.  · Will watch your condition.  · Will get help right away if you are not doing well or get worse.  Document Released: 09/12/2005 Document Revised: 12/08/2013 Document Reviewed: 08/10/2013  ExitCare® Patient Information ©2015 [employer]. This information is not intended to replace advice given to you by your health care provider. Make sure you discuss any questions you have with your health care provider.

## 2014-06-18 LAB — GC/CHLAMYDIA PROBE AMP
CT Probe RNA: NEGATIVE
GC PROBE AMP APTIMA: NEGATIVE

## 2014-10-18 ENCOUNTER — Encounter (HOSPITAL_COMMUNITY): Payer: Self-pay | Admitting: Emergency Medicine

## 2014-12-14 LAB — OB RESULTS CONSOLE GBS: STREP GROUP B AG: POSITIVE

## 2014-12-20 ENCOUNTER — Telehealth (HOSPITAL_COMMUNITY): Payer: Self-pay | Admitting: *Deleted

## 2014-12-20 ENCOUNTER — Encounter (HOSPITAL_COMMUNITY): Payer: Self-pay | Admitting: *Deleted

## 2014-12-20 NOTE — Telephone Encounter (Signed)
Preadmission screen  

## 2014-12-21 ENCOUNTER — Observation Stay (HOSPITAL_COMMUNITY)
Admission: RE | Admit: 2014-12-21 | Discharge: 2014-12-21 | Disposition: A | Discharge status: Home / Self Care | Payer: BLUE CROSS/BLUE SHIELD | Source: Ambulatory Visit | Attending: Obstetrics and Gynecology | Admitting: Obstetrics and Gynecology

## 2014-12-21 ENCOUNTER — Encounter (HOSPITAL_COMMUNITY): Payer: Self-pay

## 2014-12-21 DIAGNOSIS — O321XX0 Maternal care for breech presentation, not applicable or unspecified: Secondary | ICD-10-CM | POA: Diagnosis present

## 2014-12-21 DIAGNOSIS — Z3A00 Weeks of gestation of pregnancy not specified: Secondary | ICD-10-CM | POA: Diagnosis not present

## 2014-12-21 NOTE — H&P (Signed)
Pt was breech on last exam/us in office and presents for version. US this am confims vertex presentation.  Pt denies ctx, vb, and lof.  + FM  + FHT reassuring  A/P:  Discharge F/u in office this week for OB check

## 2014-12-21 NOTE — Discharge Summary (Signed)
Physician Discharge Summary  Patient ID: Gabriella MartensJuana S Wehner MRN: [id number] DOB/AGE: 37/03/1976 37 y.o.  Admit date: 12/21/2014 Discharge date: 12/21/2014  Admission Diagnoses: breech  Discharge Diagnoses:  Active Problems:   * No active hospital problems. *   Discharged Condition: pregancy  - vertex  Hospital Course: Pt admitted for version but US confirms vertex.    Consults: None  Significant Diagnostic Studies: ultrasound at bedside  Treatments: none  Discharge Exam: Blood pressure 109/73, pulse 91, temperature 98.2 F (36.8 C), temperature source Oral, resp. rate 18, height 5\' 2"  (1.575 m), weight 79.379 kg (175 lb), last menstrual period 04/16/2014. General appearance: alert and cooperative GI: gravid, NT  Disposition: 01-Home or Self Care     Medication List    STOP taking these medications        oxyCODONE-acetaminophen 7.5-325 MG per tablet  Commonly known as:  PERCOCET           Follow-up Information    Schedule an appointment as soon as possible for a visit in 4 days to follow up.      Signed: Antron Seth 12/21/2014, 8:07 AM

## 2015-01-02 ENCOUNTER — Encounter (HOSPITAL_COMMUNITY): Payer: Self-pay | Admitting: *Deleted

## 2015-01-02 ENCOUNTER — Inpatient Hospital Stay (HOSPITAL_COMMUNITY)
Admission: AD | Admit: 2015-01-02 | Discharge: 2015-01-04 | DRG: 775 | Disposition: A | Discharge status: Home / Self Care | Payer: BLUE CROSS/BLUE SHIELD | Source: Ambulatory Visit | Attending: Obstetrics and Gynecology | Admitting: Obstetrics and Gynecology

## 2015-01-02 DIAGNOSIS — Z3A38 38 weeks gestation of pregnancy: Secondary | ICD-10-CM | POA: Diagnosis present

## 2015-01-02 DIAGNOSIS — O99824 Streptococcus B carrier state complicating childbirth: Principal | ICD-10-CM | POA: Diagnosis present

## 2015-01-02 LAB — TYPE AND SCREEN
ABO/RH(D): O POS
ANTIBODY SCREEN: NEGATIVE

## 2015-01-02 LAB — CBC
HEMATOCRIT: 34.2 % — AB (ref 36.0–46.0)
HEMOGLOBIN: 11.4 g/dL — AB (ref 12.0–15.0)
MCH: 30.9 pg (ref 26.0–34.0)
MCHC: 33.3 g/dL (ref 30.0–36.0)
MCV: 92.7 fL (ref 78.0–100.0)
Platelets: 264 10*3/uL (ref 150–400)
RBC: 3.69 MIL/uL — AB (ref 3.87–5.11)
RDW: 13.1 % (ref 11.5–15.5)
WBC: 6.1 10*3/uL (ref 4.0–10.5)

## 2015-01-02 LAB — ABO/RH: ABO/RH(D): O POS

## 2015-01-02 MED ORDER — ONDANSETRON HCL 4 MG/2ML IJ SOLN: 4.0000 mg | INTRAMUSCULAR | Status: DC | PRN

## 2015-01-02 MED ORDER — TETANUS-DIPHTH-ACELL PERTUSSIS 5-2.5-18.5 LF-MCG/0.5 IM SUSP: 0.5000 mL | Freq: Once | INTRAMUSCULAR | Status: DC

## 2015-01-02 MED ORDER — DEXTROSE 5 % IV SOLN
5.0000 10*6.[IU] | Freq: Once | INTRAVENOUS | Status: AC
  Administered 2015-01-02: 5 10*6.[IU] via INTRAVENOUS
  Filled 2015-01-02: qty 5

## 2015-01-02 MED ORDER — DIBUCAINE 1 % RE OINT: 1.0000 "application " | TOPICAL_OINTMENT | RECTAL | Status: DC | PRN

## 2015-01-02 MED ORDER — ACETAMINOPHEN 325 MG PO TABS: 650.0000 mg | ORAL_TABLET | ORAL | Status: DC | PRN

## 2015-01-02 MED ORDER — MEDROXYPROGESTERONE ACETATE 150 MG/ML IM SUSP: 150.0000 mg | INTRAMUSCULAR | Status: DC | PRN

## 2015-01-02 MED ORDER — OXYTOCIN 40 UNITS IN LACTATED RINGERS INFUSION - SIMPLE MED
62.5000 mL/h | INTRAVENOUS | Status: DC
  Filled 2015-01-02: qty 1000

## 2015-01-02 MED ORDER — SENNOSIDES-DOCUSATE SODIUM 8.6-50 MG PO TABS
2.0000 | ORAL_TABLET | ORAL | Status: DC
  Administered 2015-01-02 – 2015-01-03 (×2): 2 via ORAL
  Filled 2015-01-02 (×2): qty 2

## 2015-01-02 MED ORDER — OXYCODONE-ACETAMINOPHEN 5-325 MG PO TABS: 2.0000 | ORAL_TABLET | ORAL | Status: DC | PRN

## 2015-01-02 MED ORDER — PENICILLIN G POTASSIUM 5000000 UNITS IJ SOLR
2.5000 10*6.[IU] | INTRAVENOUS | Status: DC
  Filled 2015-01-02 (×3): qty 2.5

## 2015-01-02 MED ORDER — ONDANSETRON HCL 4 MG PO TABS: 4.0000 mg | ORAL_TABLET | ORAL | Status: DC | PRN

## 2015-01-02 MED ORDER — OXYTOCIN BOLUS FROM INFUSION: 500.0000 mL | INTRAVENOUS | Status: DC

## 2015-01-02 MED ORDER — LACTATED RINGERS IV SOLN: 500.0000 mL | INTRAVENOUS | Status: DC | PRN

## 2015-01-02 MED ORDER — LANOLIN HYDROUS EX OINT: TOPICAL_OINTMENT | CUTANEOUS | Status: DC | PRN

## 2015-01-02 MED ORDER — OXYCODONE-ACETAMINOPHEN 5-325 MG PO TABS: 1.0000 | ORAL_TABLET | ORAL | Status: DC | PRN

## 2015-01-02 MED ORDER — LACTATED RINGERS IV SOLN
INTRAVENOUS | Status: DC
Start: 2015-01-02 — End: 2015-01-02

## 2015-01-02 MED ORDER — BENZOCAINE-MENTHOL 20-0.5 % EX AERO
1.0000 "application " | INHALATION_SPRAY | CUTANEOUS | Status: DC | PRN
  Administered 2015-01-02: 1 via TOPICAL
  Filled 2015-01-02: qty 56

## 2015-01-02 MED ORDER — LIDOCAINE HCL (PF) 1 % IJ SOLN
30.0000 mL | INTRAMUSCULAR | Status: DC | PRN
  Filled 2015-01-02: qty 30

## 2015-01-02 MED ORDER — PRENATAL MULTIVITAMIN CH
1.0000 | ORAL_TABLET | Freq: Every day | ORAL | Status: DC
  Administered 2015-01-03 – 2015-01-04 (×2): 1 via ORAL
  Filled 2015-01-02 (×2): qty 1

## 2015-01-02 MED ORDER — BUTORPHANOL TARTRATE 1 MG/ML IJ SOLN: 1.0000 mg | INTRAMUSCULAR | Status: DC | PRN

## 2015-01-02 MED ORDER — CITRIC ACID-SODIUM CITRATE 334-500 MG/5ML PO SOLN: 30.0000 mL | ORAL | Status: DC | PRN

## 2015-01-02 MED ORDER — IBUPROFEN 600 MG PO TABS
600.0000 mg | ORAL_TABLET | Freq: Four times a day (QID) | ORAL | Status: DC
  Administered 2015-01-02 – 2015-01-04 (×8): 600 mg via ORAL
  Filled 2015-01-02 (×8): qty 1

## 2015-01-02 MED ORDER — ONDANSETRON HCL 4 MG/2ML IJ SOLN: 4.0000 mg | Freq: Four times a day (QID) | INTRAMUSCULAR | Status: DC | PRN

## 2015-01-02 MED ORDER — SIMETHICONE 80 MG PO CHEW: 80.0000 mg | CHEWABLE_TABLET | ORAL | Status: DC | PRN

## 2015-01-02 MED ORDER — MEASLES, MUMPS & RUBELLA VAC ~~LOC~~ INJ
0.5000 mL | INJECTION | Freq: Once | SUBCUTANEOUS | Status: DC
  Filled 2015-01-02: qty 0.5

## 2015-01-02 MED ORDER — DIPHENHYDRAMINE HCL 25 MG PO CAPS: 25.0000 mg | ORAL_CAPSULE | Freq: Four times a day (QID) | ORAL | Status: DC | PRN

## 2015-01-02 MED ORDER — WITCH HAZEL-GLYCERIN EX PADS: 1.0000 "application " | MEDICATED_PAD | CUTANEOUS | Status: DC | PRN

## 2015-01-02 NOTE — MAU Note (Signed)
Pt presents to MAU with complaints of leakage of fluid since 105 today. Denies any vaginal bleeding. Reports mild contractions.

## 2015-01-02 NOTE — Progress Notes (Signed)
Called for delivery, upon my arrival baby on mom's chest - skin/skin Rapid second stage - SVD vigorous female infant w/ apgars 9,9 by CNM Placenta S/I/3VC No lacerations, EBL 100cc Mom and baby stable

## 2015-01-02 NOTE — H&P (Signed)
Gabriella Dalton is a 39 y.o. female presenting for labor/SROM.  No vb.  Pregnancy uncomplicated.  History OB History    Gravida Para Term Preterm AB TAB SAB Ectopic Multiple Living   2 1 1       1      Past Medical History  Diagnosis Date  . Pyloric stenosis, congenital     as an infant  . AMA (advanced maternal age) multigravida 35+    Past Surgical History  Procedure Laterality Date  . Pyloromyotomy      pyloric stenosis as an infant  . Cholecystectomy N/A 05/19/2014    Procedure: LAPAROSCOPIC CHOLECYSTECTOMY;  Surgeon: Dalia HeadingMark A Jenkins, MD;  Location: [facility];  Service: General;  Laterality: N/A;   Family History: family history includes Cancer in her father; Diabetes in her mother; Hypertension in her mother; Thyroid disease in her mother. Social History:  reports that she has never smoked. She has never used smokeless tobacco. She reports that she does not drink alcohol or use illicit drugs.   Prenatal Transfer Tool  Maternal Diabetes: No Genetic Screening: Normal Maternal Ultrasounds/Referrals: Normal Fetal Ultrasounds or other Referrals:  None Maternal Substance Abuse:  No Significant Maternal Medications:  None Significant Maternal Lab Results:  None Other Comments:  None  ROS  Dilation: 8.5 Effacement (%): 100 Station: -1 Exam by:: m wilkins rnc Blood pressure 130/77, pulse 70, temperature 98 F (36.7 C), temperature source Oral, resp. rate 16, height 5\' 2"  (1.575 m), weight 79.379 kg (175 lb), last menstrual period 04/16/2014. Exam Physical Exam  Gen - NAD Abd - gravid, NT Ext - NT Cvx 5cm on admission  Prenatal labs: ABO, Rh: O/Positive/-- (06/25 0000) Antibody: Negative (06/25 0000) Rubella: Immune (06/25 0000) RPR: Nonreactive (06/25 0000)  HBsAg: Negative (06/25 0000)  HIV: Non-reactive (06/25 0000)  GBS: Positive (12/29 0000)   Assessment/Plan: Admit Epidural prn PCN   Gabriella Dalton 01/02/2015, 4:11 PM

## 2015-01-03 LAB — CBC
HCT: 29 % — ABNORMAL LOW (ref 36.0–46.0)
Hemoglobin: 9.8 g/dL — ABNORMAL LOW (ref 12.0–15.0)
MCH: 31.3 pg (ref 26.0–34.0)
MCHC: 33.8 g/dL (ref 30.0–36.0)
MCV: 92.7 fL (ref 78.0–100.0)
PLATELETS: 231 10*3/uL (ref 150–400)
RBC: 3.13 MIL/uL — AB (ref 3.87–5.11)
RDW: 13.1 % (ref 11.5–15.5)
WBC: 7.9 10*3/uL (ref 4.0–10.5)

## 2015-01-03 LAB — RPR: RPR Ser Ql: NONREACTIVE

## 2015-01-03 NOTE — Progress Notes (Signed)
Post Partum Day 1 Subjective: no complaints, up ad lib, voiding, tolerating PO and + flatus  Objective: Blood pressure 108/61, pulse 77, temperature 98.3 F (36.8 C), temperature source Oral, resp. rate 18, height 5\' 2"  (1.575 m), weight 175 lb (79.379 kg), last menstrual period 04/16/2014, SpO2 100 %, unknown if currently breastfeeding.  Physical Exam:  General: alert and cooperative Lochia: appropriate Uterine Fundus: firm Incision: perineum intact DVT Evaluation: No evidence of DVT seen on physical exam. Negative Homan's sign. No cords or calf tenderness. No significant calf/ankle edema.   Recent Labs  01/02/15 1405 01/03/15 0610  HGB 11.4* 9.8*  HCT 34.2* 29.0*    Assessment/Plan: Plan for discharge tomorrow   LOS: 1 day   CURTIS,CAROL G 01/03/2015, 8:09 AM

## 2015-01-03 NOTE — Lactation Note (Addendum)
This note was copied from the chart of Gabriella Dalton Scroggins. Lactation Consultation Note: initial visit with mom. She reports that baby was spitty last night but has been nursing well today with no pain. Reports she breast fed her first but didn't have enough milk so supplemented with formula. Encouraged frequent feeding to promote a good milk supply. Baby asleep - mom reports he fed about 1 hour ago, BF brochure given with resources for support after DC. No questions at present. To call prn  Patient Name: Gabriella Dalton Malmberg JYNWG'NToday's Date: 01/03/2015 Reason for consult: Initial assessment   Maternal Data Formula Feeding for Exclusion: Yes Reason for exclusion: Mother's choice to formula and breast feed on admission Has patient been taught Hand Expression?: Yes Does the patient have breastfeeding experience prior to this delivery?: Yes  Feeding Feeding Type: Breast Fed Length of feed: 10 min  LATCH Score/Interventions                      Lactation Tools Discussed/Used     Consult Status Consult Status: Follow-up Date: 01/04/15 Follow-up type: In-patient    Pamelia HoitWeeks, Leilyn Frayre D 01/03/2015, 1:39 PM

## 2015-01-04 NOTE — Discharge Summary (Signed)
Obstetric Discharge Summary Reason for Admission: onset of labor and rupture of membranes Prenatal Procedures: ultrasound Intrapartum Procedures: spontaneous vaginal delivery Postpartum Procedures: none Complications-Operative and Postpartum: none HEMOGLOBIN  Date Value Ref Range Status  01/03/2015 9.8* 12.0 - 15.0 g/dL Final   HCT  Date Value Ref Range Status  01/03/2015 29.0* 36.0 - 46.0 % Final    Physical Exam:  General: alert and cooperative Lochia: appropriate Uterine Fundus: firm Incision: perineum intact DVT Evaluation: No evidence of DVT seen on physical exam. Negative Homan's sign. No cords or calf tenderness. No significant calf/ankle edema.  Discharge Diagnoses: Term Pregnancy-delivered  Discharge Information: Date: 01/04/2015 Activity: pelvic rest Diet: routine Medications: PNV and Ibuprofen Condition: stable Instructions: refer to practice specific booklet Discharge to: home   Newborn Data: Live born female  Birth Weight: 6 lb 13 oz (3090 g) APGAR: 9, 9  Home with mother.  Charmika Macdonnell G 01/04/2015, 8:05 AM

## 2015-01-04 NOTE — Lactation Note (Signed)
This note was copied from the chart of Gabriella Seth BakeJuana Overall. Lactation Consultation Note  Follow up visit made.  Baby currently nursing well and good latch observed.  Reviewed breastfeeding basics for discharge.  Mom has a history of low milk supply with her first baby 5 years ago.  She states her double electric pump should arrive today.  Encouraged to continue to feed with any feeding cue, use good stimulation and breast massage/compression during feeding and post pump after some feedings and give EBM back to baby.  Outpatient lactation services encouraged prn.  Patient Name: Gabriella Dalton WUJWJ'XToday's Date: 01/04/2015     Maternal Data    Feeding    LATCH Score/Interventions                      Lactation Tools Discussed/Used     Consult Status      Huston FoleyMOULDEN, Brayen Bunn S 01/04/2015, 9:51 AM

## 2015-01-14 ENCOUNTER — Inpatient Hospital Stay (HOSPITAL_COMMUNITY)
Admission: AD | Admit: 2015-01-14 | Payer: BC Managed Care – PPO | Source: Ambulatory Visit | Admitting: Obstetrics and Gynecology

## 2015-01-17 IMAGING — US US ABDOMEN LIMITED
1 series · 14 of 25 positions shown · non-contrast
Comparison: None.

CLINICAL DATA: Right upper quadrant pain.

EXAM:
US ABDOMEN LIMITED - RIGHT UPPER QUADRANT

[Series 1: us abdomen limited · 0.22mm/px · 14 of 67 slices shown]
[im 1/67]
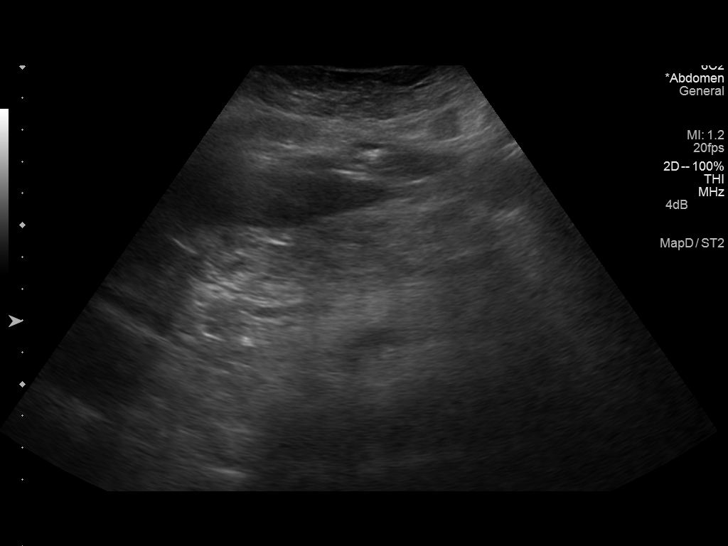
[im 6/67]
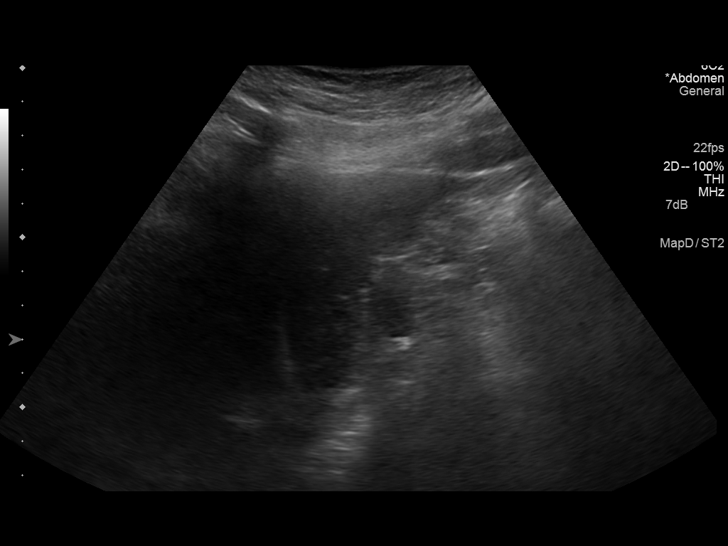
[im 12/67]
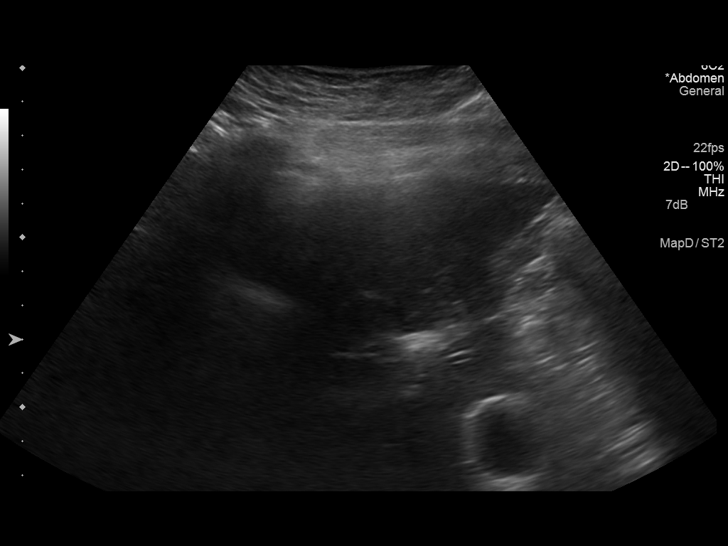
[im 17/67]
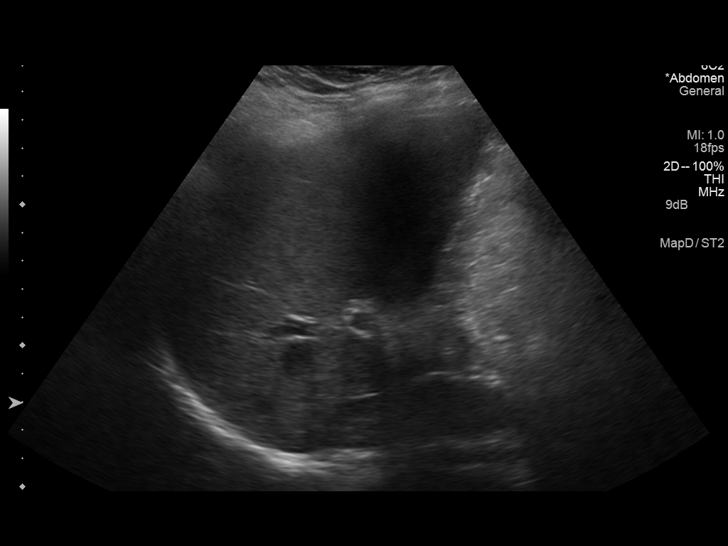
[im 23/67]
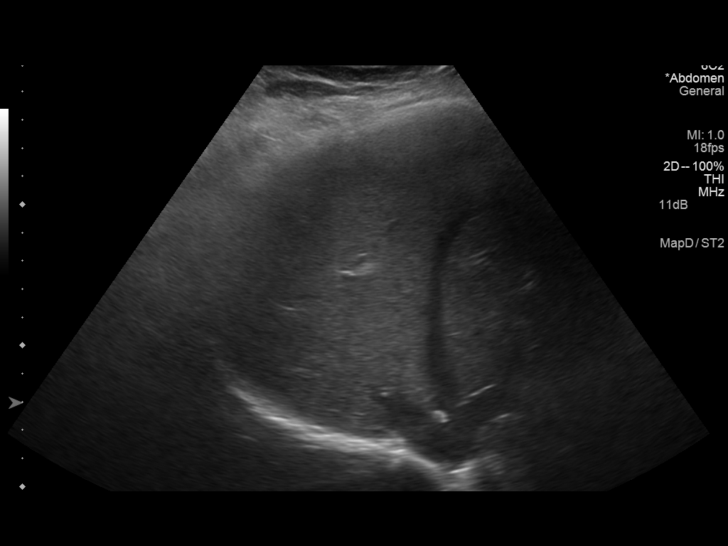
[im 25/67]
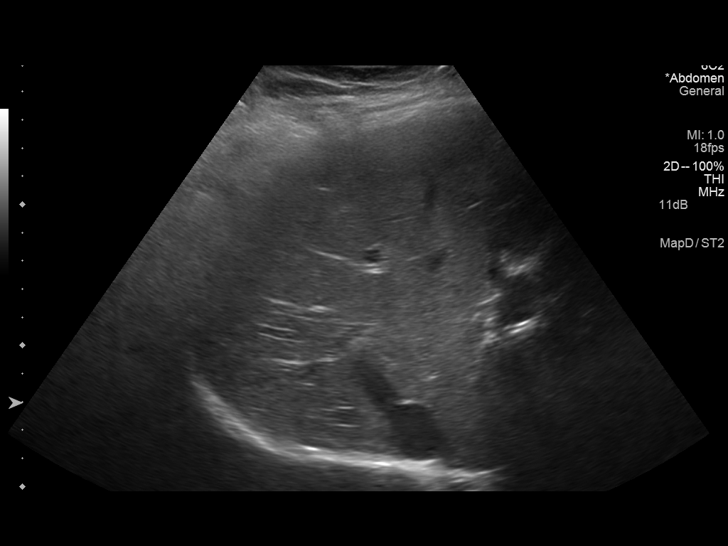
[im 31/67]
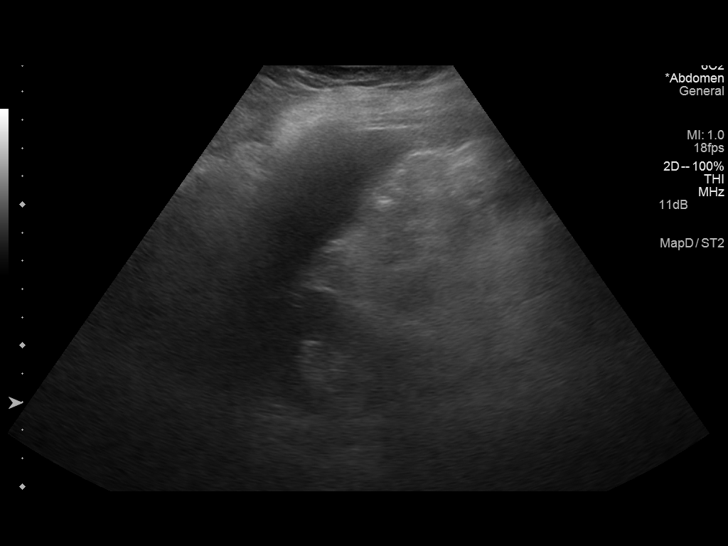
[im 36/67]
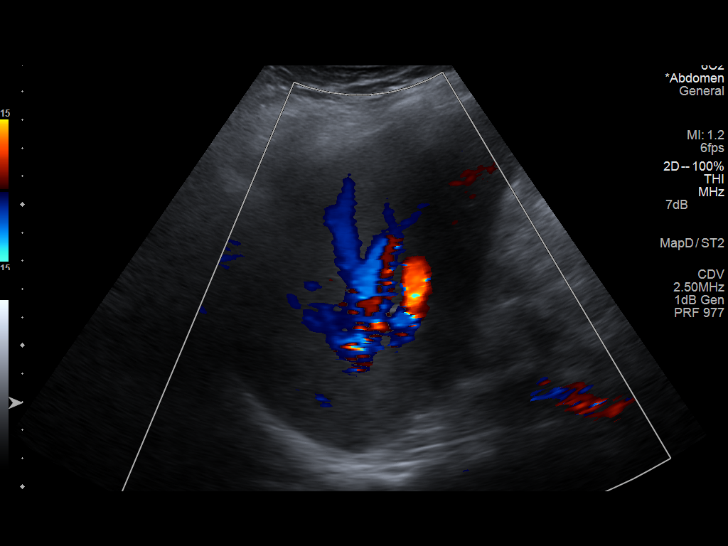
[im 42/67]
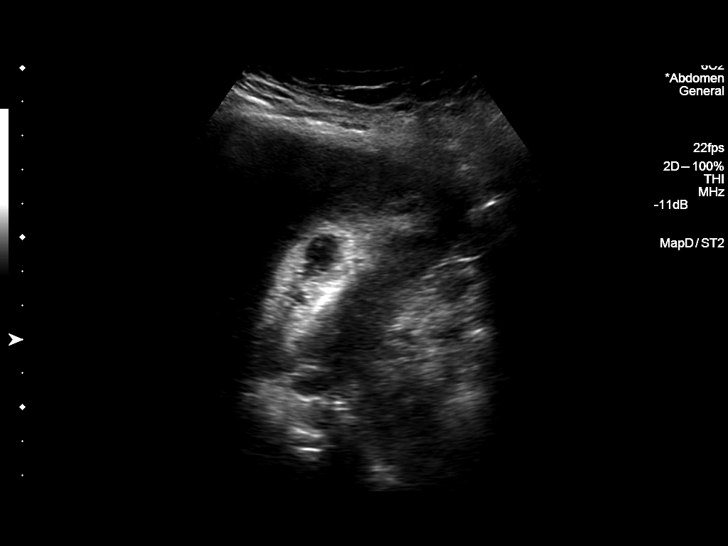
[im 45/67]
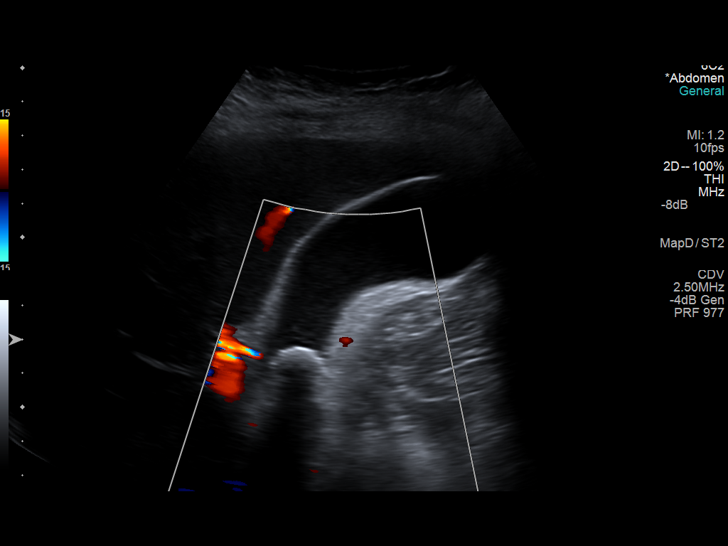
[im 50/67]
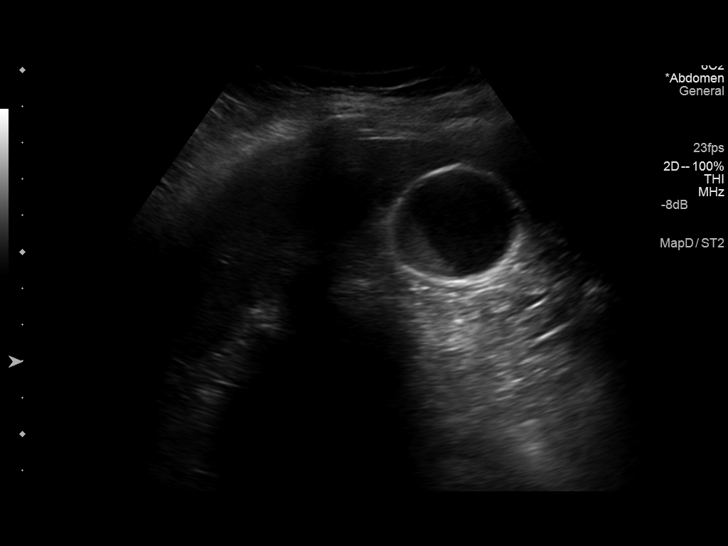
[im 56/67]
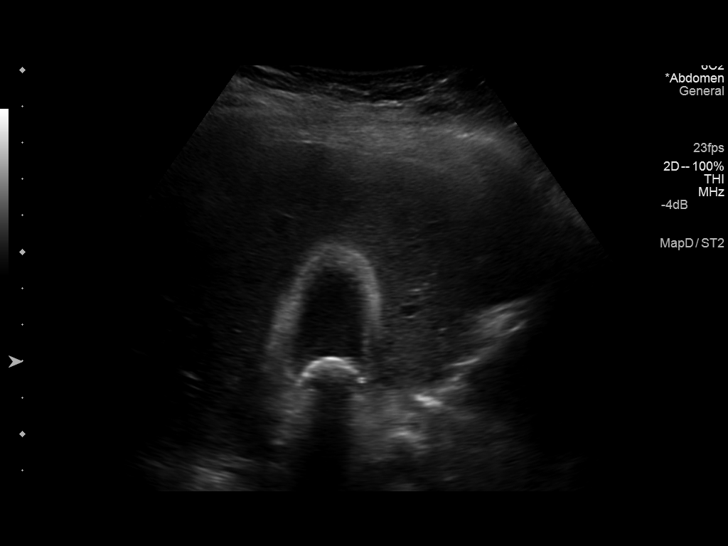
[im 61/67]
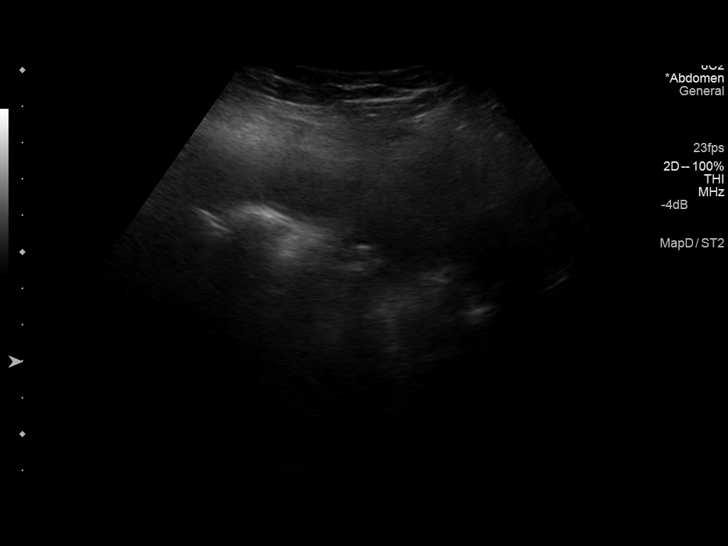
[im 67/67]
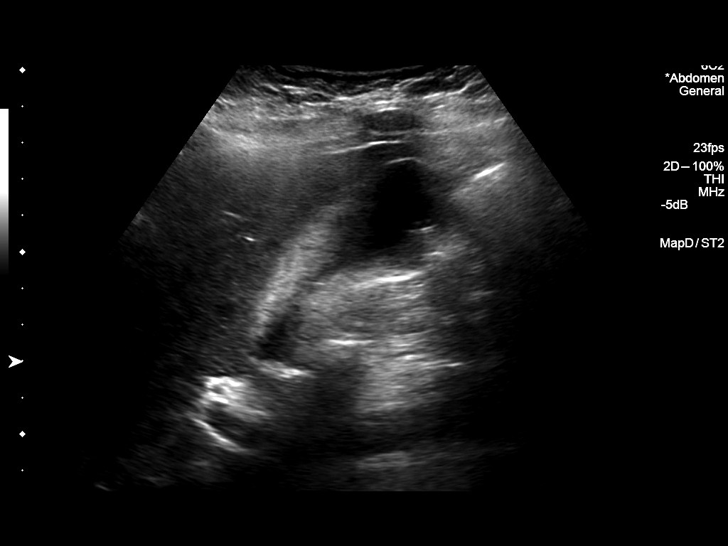

[14 of 25 positions shown; findings below may reference images not displayed]

FINDINGS: Gallbladder:

Shadowing echogenic stones measure up to approximately 1.9 cm.
Nonshadowing echogenic sludge is seen as well. No gallbladder wall
thickening. Negative sonographic Murphy sign.

Common bile duct:

Diameter: 3 mm, within normal limits.

Liver:

No focal lesion identified. Within normal limits in parenchymal
echogenicity.
IMPRESSION: Cholelithiasis without acute cholecystitis.

## 2015-02-09 ENCOUNTER — Other Ambulatory Visit: Payer: Self-pay | Admitting: Obstetrics and Gynecology

## 2015-02-10 LAB — CYTOLOGY - PAP

## 2018-08-30 ENCOUNTER — Emergency Department (HOSPITAL_COMMUNITY)
Admission: EM | Admit: 2018-08-30 | Discharge: 2018-08-30 | Disposition: A | Discharge status: Home / Self Care | Payer: Managed Care, Other (non HMO) | Attending: Emergency Medicine | Admitting: Emergency Medicine

## 2018-08-30 ENCOUNTER — Other Ambulatory Visit: Payer: Self-pay

## 2018-08-30 ENCOUNTER — Encounter (HOSPITAL_COMMUNITY): Payer: Self-pay | Admitting: Emergency Medicine

## 2018-08-30 DIAGNOSIS — N751 Abscess of Bartholin's gland: Secondary | ICD-10-CM

## 2018-08-30 DIAGNOSIS — Z79899 Other long term (current) drug therapy: Secondary | ICD-10-CM | POA: Insufficient documentation

## 2018-08-30 HISTORY — DX: Cyst of Bartholin's gland: N75.0

## 2018-08-30 MED ORDER — LIDOCAINE HCL (PF) 1 % IJ SOLN
INTRAMUSCULAR | Status: AC
  Administered 2018-08-30: 13:00:00
  Filled 2018-08-30: qty 5

## 2018-08-30 MED ORDER — LIDOCAINE HCL (PF) 1 % IJ SOLN
INTRAMUSCULAR | Status: AC
  Administered 2018-08-30: 5 mL
  Filled 2018-08-30: qty 5

## 2018-08-30 MED ORDER — HYDROCODONE-ACETAMINOPHEN 5-325 MG PO TABS: 1.0000 | ORAL_TABLET | ORAL | 0 refills | Status: DC | PRN

## 2018-08-30 MED ORDER — POVIDONE-IODINE 10 % EX SOLN
CUTANEOUS | Status: AC
  Administered 2018-08-30: 13:00:00
  Filled 2018-08-30: qty 15

## 2018-08-30 NOTE — Discharge Instructions (Addendum)
Complete the antibiotics your gynecologist prescribed.  Start warm water soaks as discussed twice daily to keep this open and draining.  The packing should be removed in 3 days, if still present (may come out on its own). You may take the hydrocodone prescribed for pain relief.  This will make you drowsy - do not drive within 4 hours of taking this medication.

## 2018-08-30 NOTE — ED Provider Notes (Signed)
[facility] EMERGENCY DEPARTMENT Provider Note   CSN: [id number] Arrival date & time: 08/30/18  1138     History   Chief Complaint Chief Complaint  Patient presents with  . Cyst    HPI Gabriella Dalton is a 42 y.o. female with a history of infrequent episodes of bartholin cyst infections (with I&D x 2, last occurring about 6 years ago) developed increased swelling and soreness and started on keflex by her gynecologist 3 days ago.  Over the past 2 days, she reports increased swelling and pain which has been constant. There has been no drainage from the site. She denies fevers, chills, n/v or other complaint. She has been compliant with her antibiotics.   The history is provided by the patient.    Past Medical History:  Diagnosis Date  . AMA (advanced maternal age) multigravida 35+   . Bartholin cyst   . Pyloric stenosis, congenital    as an infant    Patient Active Problem List   Diagnosis Date Noted  . Indication for care in labor or delivery 01/02/2015  . SVD (spontaneous vaginal delivery) 01/02/2015  . Low back pain 08/06/2013    Past Surgical History:  Procedure Laterality Date  . CHOLECYSTECTOMY N/A 05/19/2014   Procedure: LAPAROSCOPIC CHOLECYSTECTOMY;  Surgeon: Dalia Heading, MD;  Location: [facility];  Service: General;  Laterality: N/A;  . PYLOROMYOTOMY     pyloric stenosis as an infant     OB History    Gravida  2   Para  2   Term  2   Preterm      AB      Living  2     SAB      TAB      Ectopic      Multiple  0   Live Births  2            Home Medications    Prior to Admission medications   Medication Sig Start Date End Date Taking? Authorizing Provider  HYDROcodone-acetaminophen (NORCO/VICODIN) 5-325 MG tablet Take 1 tablet by mouth every 4 (four) hours as needed. 08/30/18   Burgess Amor, PA-C  Prenatal Vit-Fe Fumarate-FA (PRENATAL MULTIVITAMIN) TABS tablet Take 1 tablet by mouth daily.    [provider]    Family  History Family History  Problem Relation Age of Onset  . Hypertension Mother   . Thyroid disease Mother   . Diabetes Mother   . Cancer Father        prostate and lymphoma    Social History Social History   Tobacco Use  . Smoking status: Never Smoker  . Smokeless tobacco: Never Used  Substance Use Topics  . Alcohol use: No  . Drug use: No     Allergies   Patient has no known allergies.   Review of Systems Review of Systems  Constitutional: Negative for chills and fever.  HENT: Negative.   Respiratory: Negative.   Cardiovascular: Negative.   Gastrointestinal: Negative.   Genitourinary: Negative for dysuria, pelvic pain and vaginal discharge.  Musculoskeletal: Negative.      Physical Exam Updated Vital Signs BP 111/70 (BP Location: Right Arm)   Pulse 74   Temp 98.3 F (36.8 C) (Oral)   Resp 15   Ht 5\' 2"  (1.575 m)   Wt 77.1 kg   SpO2 100%   BMI 31.09 kg/m   Physical Exam  Constitutional: She appears well-developed and well-nourished. No distress.  HENT:  Head: Normocephalic.  Cardiovascular:  Normal rate.  Pulmonary/Chest: Effort normal. She has no wheezes.  Genitourinary:    There is tenderness on the right labia.  Genitourinary Comments: Large soft edema right posterior labia. No drainage, no erythema.   Musculoskeletal: Normal range of motion.  Skin: Rash noted.     ED Treatments / Results  Labs (all labs ordered are listed, but only abnormal results are displayed) Labs Reviewed - No data to display  EKG None  Radiology No results found.  Procedures Procedures (including critical care time)  INCISION AND DRAINAGE Performed by: Burgess AmorIDOL, Theodoros Stjames Consent: Verbal consent obtained. Risks and benefits: risks, benefits and alternatives were discussed Type: abscess  Body area: right labia  Anesthesia: local infiltration  Incision was made with a scalpel.  Local anesthetic: lidocaine 1% without epinephrine  Anesthetic total: 2  ml  Complexity: complex Blunt dissection to break up loculations  Drainage: purulent  Drainage amount: copious  Packing material: 1/4 in iodoform gauze  Patient tolerance: Patient tolerated the procedure well with no immediate complications.     Medications Ordered in ED Medications  povidone-iodine (BETADINE) 10 % external solution (  Given 08/30/18 1234)  lidocaine (PF) (XYLOCAINE) 1 % injection (5 mLs  Given 08/30/18 1233)  lidocaine (PF) (XYLOCAINE) 1 % injection (  Given 08/30/18 1318)     Initial Impression / Assessment and Plan / ED Course  I have reviewed the triage vital signs and the nursing notes.  Pertinent labs & imaging results that were available during my care of the patient were reviewed by me and considered in my medical decision making (see chart for details).     Pt tolerated procedure without complication.  She was advised close f/u with gyn, continue taking her abx, warm compresses, hydrocodone prn.  Return precautions discussed.  Final Clinical Impressions(s) / ED Diagnoses   Final diagnoses:  Abscess of Bartholin's gland    ED Discharge Orders         Ordered    HYDROcodone-acetaminophen (NORCO/VICODIN) 5-325 MG tablet  Every 4 hours PRN     08/30/18 1317           Burgess Amordol, Thorsten Climer, PA-C 08/30/18 1511    Bethann BerkshireZammit, Joseph, MD 08/30/18 1601

## 2018-08-30 NOTE — ED Triage Notes (Signed)
Patient was seen by OB/GYN  On 9/11 and diagnosed with a  Infected bartholin cyst. Patient was given cephalexin 500mg . Per patient now having pain. Denies any fevers.

## 2018-10-14 NOTE — H&P (Addendum)
Gabriella Dalton is an 42 y.o. female with recurrent bartholin's gland cyst presents for surgical mngt.  S/p multiple I&D procedures  Pertinent Gynecological History: Menses: flow is light  Contraception: IUD, mirena  Menstrual History: No LMP recorded. (Menstrual status: IUD).    Past Medical History:  Diagnosis Date  . AMA (advanced maternal age) multigravida 35+   . Bartholin cyst   . Pyloric stenosis, congenital    as an infant    Past Surgical History:  Procedure Laterality Date  . CHOLECYSTECTOMY N/A 05/19/2014   Procedure: LAPAROSCOPIC CHOLECYSTECTOMY;  Surgeon: Dalia Heading, MD;  Location: [facility];  Service: General;  Laterality: N/A;  . PYLOROMYOTOMY     pyloric stenosis as an infant    Family History  Problem Relation Age of Onset  . Hypertension Mother   . Thyroid disease Mother   . Diabetes Mother   . Cancer Father        prostate and lymphoma    Social History:  reports that she has never smoked. She has never used smokeless tobacco. She reports that she does not drink alcohol or use drugs.  Allergies: No Known Allergies  No medications prior to admission.    ROS  Physical Exam  Gen - NAD Abd - soft, NT CV - RRR Lungs - clear bilaterally Ext - NT, no edema Ext - 4.5cm fluctuant mass right bartholins gland, NT, no erythema  Assessment/Plan: Recurrent bartholin's cyst Marsupialization of right bartholin's cyst R/b/a discussed, questions answered, informed consent  Gabriella Dalton 10/14/2018, 2:10 PM

## 2018-10-17 ENCOUNTER — Other Ambulatory Visit: Payer: Self-pay

## 2018-10-17 ENCOUNTER — Encounter (HOSPITAL_BASED_OUTPATIENT_CLINIC_OR_DEPARTMENT_OTHER): Payer: Self-pay

## 2018-10-17 NOTE — Progress Notes (Signed)
Spoke with:  Gabriella Dalton NPO:  After Midnight, no gum, candy, or mints   Arrival time: 0530AM Labs: (CBC and T&S 10/20/2018 epic) AM medications: None Pre op orders: yes Ride home:  [name] (husband) [phone]

## 2018-10-20 NOTE — Progress Notes (Signed)
Spoke with patient and made aware cbc and type and screen to be done am of surgery 10-23-18 and do not go to [facility] for labs today. Spoke with hillary at [facility] lab and made aware patient not coming for labs and cbc and type and screen order placed back in epic.

## 2018-10-22 NOTE — Progress Notes (Signed)
Spoke with Herbert Seta regarding need for CBC,T&S in am-orders received may discontinue per Dr Renaldo Fiddler.

## 2018-10-23 ENCOUNTER — Ambulatory Visit (HOSPITAL_BASED_OUTPATIENT_CLINIC_OR_DEPARTMENT_OTHER)
Admission: RE | Admit: 2018-10-23 | Discharge: 2018-10-23 | Disposition: A | Discharge status: Home / Self Care | Payer: Managed Care, Other (non HMO) | Source: Ambulatory Visit | Attending: Obstetrics and Gynecology | Admitting: Obstetrics and Gynecology

## 2018-10-23 ENCOUNTER — Ambulatory Visit (HOSPITAL_BASED_OUTPATIENT_CLINIC_OR_DEPARTMENT_OTHER): Payer: Managed Care, Other (non HMO) | Admitting: Anesthesiology

## 2018-10-23 ENCOUNTER — Other Ambulatory Visit: Payer: Self-pay

## 2018-10-23 ENCOUNTER — Encounter (HOSPITAL_BASED_OUTPATIENT_CLINIC_OR_DEPARTMENT_OTHER)
Admission: RE | Disposition: A | Discharge status: Home / Self Care | Payer: Self-pay | Source: Ambulatory Visit | Attending: Obstetrics and Gynecology

## 2018-10-23 ENCOUNTER — Encounter (HOSPITAL_BASED_OUTPATIENT_CLINIC_OR_DEPARTMENT_OTHER): Payer: Self-pay | Admitting: *Deleted

## 2018-10-23 DIAGNOSIS — N750 Cyst of Bartholin's gland: Secondary | ICD-10-CM | POA: Insufficient documentation

## 2018-10-23 HISTORY — PX: BARTHOLIN CYST MARSUPIALIZATION: SHX5383

## 2018-10-23 HISTORY — DX: Personal history of other diseases of the digestive system: Z87.19

## 2018-10-23 LAB — POCT PREGNANCY, URINE: Preg Test, Ur: NEGATIVE

## 2018-10-23 SURGERY — MARSUPIALIZATION, CYST, BARTHOLIN'S GLAND
Anesthesia: General | Site: Perineum

## 2018-10-23 MED ORDER — KETOROLAC TROMETHAMINE 30 MG/ML IJ SOLN
INTRAMUSCULAR | Status: AC
  Filled 2018-10-23: qty 1

## 2018-10-23 MED ORDER — DEXAMETHASONE SODIUM PHOSPHATE 10 MG/ML IJ SOLN
INTRAMUSCULAR | Status: DC | PRN
  Administered 2018-10-23: 10 mg via INTRAVENOUS

## 2018-10-23 MED ORDER — WHITE PETROLATUM EX OINT
TOPICAL_OINTMENT | CUTANEOUS | Status: AC
  Filled 2018-10-23: qty 5

## 2018-10-23 MED ORDER — SODIUM CHLORIDE 0.9 % IV SOLN
2.0000 g | INTRAVENOUS | Status: AC
  Administered 2018-10-23: 2 g via INTRAVENOUS
  Filled 2018-10-23: qty 2

## 2018-10-23 MED ORDER — SODIUM CHLORIDE 0.9 % IV SOLN
INTRAVENOUS | Status: AC
  Filled 2018-10-23: qty 2

## 2018-10-23 MED ORDER — LACTATED RINGERS IV SOLN
INTRAVENOUS | Status: DC
  Administered 2018-10-23: 06:00:00 via INTRAVENOUS
  Filled 2018-10-23: qty 1000

## 2018-10-23 MED ORDER — LIDOCAINE HCL (PF) 1 % IJ SOLN
INTRAMUSCULAR | Status: DC | PRN
  Administered 2018-10-23: 2 mL

## 2018-10-23 MED ORDER — ONDANSETRON HCL 4 MG/2ML IJ SOLN
INTRAMUSCULAR | Status: AC
  Filled 2018-10-23: qty 2

## 2018-10-23 MED ORDER — HYDROMORPHONE HCL 1 MG/ML IJ SOLN
0.2500 mg | INTRAMUSCULAR | Status: DC | PRN
  Filled 2018-10-23: qty 0.5

## 2018-10-23 MED ORDER — MIDAZOLAM HCL 2 MG/2ML IJ SOLN
INTRAMUSCULAR | Status: AC
  Filled 2018-10-23: qty 2

## 2018-10-23 MED ORDER — FENTANYL CITRATE (PF) 100 MCG/2ML IJ SOLN
INTRAMUSCULAR | Status: AC
  Filled 2018-10-23: qty 2

## 2018-10-23 MED ORDER — IBUPROFEN 600 MG PO TABS: 600.0000 mg | ORAL_TABLET | Freq: Four times a day (QID) | ORAL | 0 refills | Status: AC | PRN

## 2018-10-23 MED ORDER — PROMETHAZINE HCL 25 MG/ML IJ SOLN
6.2500 mg | INTRAMUSCULAR | Status: DC | PRN
  Filled 2018-10-23: qty 1

## 2018-10-23 MED ORDER — EPHEDRINE 5 MG/ML INJ
INTRAVENOUS | Status: AC
  Filled 2018-10-23: qty 10

## 2018-10-23 MED ORDER — LACTATED RINGERS IV SOLN
INTRAVENOUS | Status: DC
  Filled 2018-10-23: qty 1000

## 2018-10-23 MED ORDER — PROPOFOL 10 MG/ML IV BOLUS
INTRAVENOUS | Status: AC
  Filled 2018-10-23: qty 40

## 2018-10-23 MED ORDER — PROPOFOL 10 MG/ML IV BOLUS
INTRAVENOUS | Status: DC | PRN
  Administered 2018-10-23: 180 mg via INTRAVENOUS

## 2018-10-23 MED ORDER — MEPERIDINE HCL 25 MG/ML IJ SOLN
6.2500 mg | INTRAMUSCULAR | Status: DC | PRN
  Filled 2018-10-23: qty 1

## 2018-10-23 MED ORDER — LIDOCAINE 2% (20 MG/ML) 5 ML SYRINGE
INTRAMUSCULAR | Status: AC
  Filled 2018-10-23: qty 5

## 2018-10-23 MED ORDER — FENTANYL CITRATE (PF) 100 MCG/2ML IJ SOLN
INTRAMUSCULAR | Status: DC | PRN
  Administered 2018-10-23: 50 ug via INTRAVENOUS

## 2018-10-23 MED ORDER — ONDANSETRON HCL 4 MG/2ML IJ SOLN
INTRAMUSCULAR | Status: DC | PRN
  Administered 2018-10-23: 4 mg via INTRAVENOUS

## 2018-10-23 MED ORDER — LIDOCAINE 2% (20 MG/ML) 5 ML SYRINGE
INTRAMUSCULAR | Status: DC | PRN
  Administered 2018-10-23: 50 mg via INTRAVENOUS

## 2018-10-23 MED ORDER — MIDAZOLAM HCL 2 MG/2ML IJ SOLN
INTRAMUSCULAR | Status: DC | PRN
  Administered 2018-10-23: 2 mg via INTRAVENOUS

## 2018-10-23 MED ORDER — DEXAMETHASONE SODIUM PHOSPHATE 10 MG/ML IJ SOLN
INTRAMUSCULAR | Status: AC
  Filled 2018-10-23: qty 1

## 2018-10-23 MED ORDER — OXYCODONE-ACETAMINOPHEN 5-325 MG PO TABS: 1.0000 | ORAL_TABLET | ORAL | 0 refills | Status: AC | PRN

## 2018-10-23 SURGICAL SUPPLY — 27 items
BLADE SURG 15 STRL LF DISP TIS (BLADE) ×1 IMPLANT
BLADE SURG 15 STRL SS (BLADE) ×3
CLOTH BEACON ORANGE TIMEOUT ST (SAFETY) ×3 IMPLANT
COVER WAND RF STERILE (DRAPES) ×3 IMPLANT
DRAIN PENROSE 18X1/4 LTX STRL (WOUND CARE) IMPLANT
ELECT NEEDLE TIP 2.8 STRL (NEEDLE) IMPLANT
GAUZE IODOFORM PACK 1/2 7832 (GAUZE/BANDAGES/DRESSINGS) IMPLANT
GAUZE PACKING 1/2X5YD (GAUZE/BANDAGES/DRESSINGS) IMPLANT
GLOVE BIO SURGEON STRL SZ 6.5 (GLOVE) ×2 IMPLANT
GLOVE BIO SURGEONS STRL SZ 6.5 (GLOVE) ×1
GLOVE BIOGEL PI IND STRL 7.0 (GLOVE) ×1 IMPLANT
GLOVE BIOGEL PI INDICATOR 7.0 (GLOVE) ×2
GOWN STRL REUS W/TWL LRG LVL3 (GOWN DISPOSABLE) ×6 IMPLANT
KIT TURNOVER CYSTO (KITS) ×3 IMPLANT
NEEDLE HYPO 25X1 1.5 SAFETY (NEEDLE) ×3 IMPLANT
NS IRRIG 500ML POUR BTL (IV SOLUTION) ×3 IMPLANT
PACK PERINEAL COLD (PAD) ×3 IMPLANT
PACK VAGINAL MINOR WOMEN LF (CUSTOM PROCEDURE TRAY) IMPLANT
PACK VAGINAL WOMENS (CUSTOM PROCEDURE TRAY) ×3 IMPLANT
PAD OB MATERNITY 4.3X12.25 (PERSONAL CARE ITEMS) ×3 IMPLANT
SUT VICRYL 0 UR6 27IN ABS (SUTURE) IMPLANT
SUT VICRYL RAPIDE 3 0 (SUTURE) ×6 IMPLANT
SWAB COLLECTION DEVICE MRSA (MISCELLANEOUS) IMPLANT
SYR BULB IRRIGATION 50ML (SYRINGE) ×3 IMPLANT
TOWEL OR 17X24 6PK STRL BLUE (TOWEL DISPOSABLE) ×6 IMPLANT
TUBE ANAEROBIC SPECIMEN COL (MISCELLANEOUS) IMPLANT
WATER STERILE IRR 500ML POUR (IV SOLUTION) IMPLANT

## 2018-10-23 NOTE — Anesthesia Postprocedure Evaluation (Signed)
Anesthesia Post Note  Patient: Gabriella Dalton  Procedure(s) Performed: BARTHOLIN CYST MARSUPIALIZATION and excision (N/A Perineum)     Patient location during evaluation: PACU Anesthesia Type: General Level of consciousness: awake and alert Pain management: pain level controlled Vital Signs Assessment: post-procedure vital signs reviewed and stable Respiratory status: spontaneous breathing, nonlabored ventilation, respiratory function stable and patient connected to nasal cannula oxygen Cardiovascular status: blood pressure returned to baseline and stable Postop Assessment: no apparent nausea or vomiting Anesthetic complications: no    Last Vitals:  Vitals:   10/23/18 0845 10/23/18 0915  BP: 106/70 108/67  Pulse: 66 62  Resp: 20 20  Temp:  36.6 C  SpO2: 100% 100%    Last Pain:  Vitals:   10/23/18 0915  TempSrc:   PainSc: 0-No pain                 Shelton Silvas

## 2018-10-23 NOTE — Transfer of Care (Signed)
Immediate Anesthesia Transfer of Care Note  Patient: Gabriella Dalton  Procedure(s) Performed: Procedure(s) (LRB): BARTHOLIN CYST MARSUPIALIZATION and excision (N/A)  Patient Location: PACU  Anesthesia Type: General  Level of Consciousness: awake, oriented, sedated and patient cooperative  Airway & Oxygen Therapy: Patient Spontanous Breathing and Patient connected to face mask oxygen  Post-op Assessment: Report given to PACU RN and Post -op Vital signs reviewed and stable  Post vital signs: Reviewed and stable  Complications: No apparent anesthesia complications Last Vitals:  Vitals Value Taken Time  BP 114/64 10/23/2018  8:07 AM  Temp    Pulse 79 10/23/2018  8:08 AM  Resp    SpO2 99 % 10/23/2018  8:08 AM  Vitals shown include unvalidated device data.  Last Pain:  Vitals:   10/23/18 0611  TempSrc:   PainSc: 0-No pain      Patients Stated Pain Goal: 8 (10/23/18 7829)

## 2018-10-23 NOTE — Anesthesia Preprocedure Evaluation (Addendum)
Anesthesia Evaluation  Patient identified by MRN, date of birth, ID band Patient awake    Reviewed: Allergy & Precautions, NPO status , Patient's Chart, lab work & pertinent test results  Airway Mallampati: I  TM Distance: >3 FB Neck ROM: Full    Dental  (+) Teeth Intact, Dental Advisory Given   Pulmonary neg pulmonary ROS,    breath sounds clear to auscultation       Cardiovascular negative cardio ROS   Rhythm:Regular Rate:Normal     Neuro/Psych negative neurological ROS     GI/Hepatic negative GI ROS, Neg liver ROS,   Endo/Other  negative endocrine ROS  Renal/GU negative Renal ROS     Musculoskeletal negative musculoskeletal ROS (+)   Abdominal Normal abdominal exam  (+)   Peds  Hematology negative hematology ROS (+)   Anesthesia Other Findings   Reproductive/Obstetrics                            Anesthesia Physical Anesthesia Plan  ASA: II  Anesthesia Plan: General   Post-op Pain Management:    Induction: Intravenous  PONV Risk Score and Plan: 4 or greater and Ondansetron, Dexamethasone, Midazolam and Scopolamine patch - Pre-op  Airway Management Planned: LMA  Additional Equipment: None  Intra-op Plan:   Post-operative Plan: Extubation in OR  Informed Consent: I have reviewed the patients History and Physical, chart, labs and discussed the procedure including the risks, benefits and alternatives for the proposed anesthesia with the patient or authorized representative who has indicated his/her understanding and acceptance.     Dental advisory given  Plan Discussed with: CRNA  Anesthesia Plan Comments:        Anesthesia Quick Evaluation  

## 2018-10-23 NOTE — Anesthesia Procedure Notes (Signed)
Procedure Name: LMA Insertion Date/Time: 10/23/2018 7:30 AM Performed by: Francie Massing, CRNA Pre-anesthesia Checklist: Patient identified, Emergency Drugs available, Suction available and Patient being monitored Patient Re-evaluated:Patient Re-evaluated prior to induction Oxygen Delivery Method: Circle system utilized Preoxygenation: Pre-oxygenation with 100% oxygen Induction Type: IV induction Ventilation: Mask ventilation without difficulty LMA: LMA inserted LMA Size: 4.0 Number of attempts: 1 Airway Equipment and Method: Bite block Placement Confirmation: positive ETCO2 Tube secured with: Tape Dental Injury: Teeth and Oropharynx as per pre-operative assessment

## 2018-10-23 NOTE — Discharge Instructions (Signed)
[facility] office 2-3 weeks for postop appointment.  Call the office [phone] for an appointment.  Personal Hygiene: Use pads not tampons x 1week You may shower, no tub baths or pools for 2-3 weeks Wipe from front to back when using restroom  Activity: Do not drive or operate any equipment for 24 hrs.   Do not rest in bed all day Walking is encouraged Walk up and down stairs slowly You may return to your normal activity in 1-2 days  Sexual Activity:  No intercourse for 2 weeks after the procedure.  Diet: Eat a light meal as desired this evening.  You may resume your usual diet tomorrow.  Return to work:  You may resume your work activities after 1-2 days  What to expect:  Expect to have vaginal bleeding/discharge for 2-3 days and spotting for 10-14 days.  It is not unusual to have soreness for 1-2 weeks.  You may have a slight burning sensation when you urinate for the first few days.  You may start your menses in 2-6 weeks.  Mild cramps may continue for a couple of days.    Call your doctor:   Excessive bleeding, saturating a pad every hour Inability to urinate 6 hours after discharge Pain not relieved with pain medications Fever of 100.4 or greater   Post Anesthesia Home Care Instructions  Activity: Get plenty of rest for the remainder of the day. A responsible individual must stay with you for 24 hours following the procedure.  For the next 24 hours, DO NOT: -Drive a car -Advertising copywriter -Drink alcoholic beverages -Take any medication unless instructed by your physician -Make any legal decisions or sign important papers.  Meals: Start with liquid foods such as gelatin or soup. Progress to regular foods as tolerated. Avoid greasy, spicy, heavy foods. If nausea and/or vomiting occur, drink only clear liquids until the nausea and/or vomiting subsides. Call your physician if vomiting continues.  Special Instructions/Symptoms: Your throat may feel dry or sore from the anesthesia  or the breathing tube placed in your throat during surgery. If this causes discomfort, gargle with warm salt water. The discomfort should disappear within 24 hours.

## 2018-10-23 NOTE — Op Note (Signed)
10/23/2018  8:04 AM  PATIENT:  Gabriella Dalton  42 y.o. female  PRE-OPERATIVE DIAGNOSIS:  recurrent Bartholins cyst  POST-OPERATIVE DIAGNOSIS:  recurrent Bartholins cyst  PROCEDURE:  Procedure(s) with comments: BARTHOLIN CYST MARSUPIALIZATION and excision (N/A) - need bovie  Partial excision of bartholins cyst wall  SURGEON:  Surgeon(s) and Role:    Zelphia Cairo, MD - Primary   ANESTHESIA:   general  EBL:  minimal  BLOOD ADMINISTERED:none  DRAINS: none   LOCAL MEDICATIONS USED:  LIDOCAINE   SPECIMEN:  Source of Specimen:  bartholins cyst wall  DISPOSITION OF SPECIMEN:  PATHOLOGY   Procedure: Patient was taken to OR after informed consent was obtained.  She was given general anesthesia and placed in dorsal lithotomy position.  Prepped and sterile drape.  Incision was made over mucosal surface of right bartholin's gland cyst and large amount of brown fluid was drained.  Alice clamps placed on incision edges and cyst wall partially excised with scissors.  Hemostasis achieved with the bovie and interrupted suture using 3-0 vicryl rapide.  Cyst wall edges were then sutured open using vicryl rapide in a running locked fashion.  Hemostasis was noted and patient was extubated and taken to recovery.   COUNTS:  YES  TOURNIQUET:  * No tourniquets in log *  PLAN OF CARE: Discharge to home after PACU  PATIENT DISPOSITION:  PACU - hemodynamically stable.   Delay start of Pharmacological VTE agent (>24hrs) due to surgical blood loss or risk of bleeding: no

## 2018-10-24 ENCOUNTER — Encounter (HOSPITAL_BASED_OUTPATIENT_CLINIC_OR_DEPARTMENT_OTHER): Payer: Self-pay | Admitting: Obstetrics and Gynecology

## 2021-06-12 ENCOUNTER — Ambulatory Visit
Admission: EM | Admit: 2021-06-12 | Discharge: 2021-06-12 | Disposition: A | Discharge status: Home / Self Care | Payer: Managed Care, Other (non HMO) | Attending: Family Medicine | Admitting: Family Medicine

## 2021-06-12 ENCOUNTER — Other Ambulatory Visit: Payer: Self-pay

## 2021-06-12 DIAGNOSIS — L249 Irritant contact dermatitis, unspecified cause: Secondary | ICD-10-CM

## 2021-06-12 DIAGNOSIS — L089 Local infection of the skin and subcutaneous tissue, unspecified: Secondary | ICD-10-CM

## 2021-06-12 DIAGNOSIS — B9689 Other specified bacterial agents as the cause of diseases classified elsewhere: Secondary | ICD-10-CM

## 2021-06-12 MED ORDER — DOXYCYCLINE HYCLATE 100 MG PO CAPS: 100.0000 mg | ORAL_CAPSULE | Freq: Two times a day (BID) | ORAL | 0 refills | Status: AC

## 2021-06-12 MED ORDER — TRIAMCINOLONE ACETONIDE 0.1 % EX CREA: 1.0000 "application " | TOPICAL_CREAM | Freq: Two times a day (BID) | CUTANEOUS | 0 refills | Status: AC

## 2021-06-12 MED ORDER — DEXAMETHASONE SODIUM PHOSPHATE 10 MG/ML IJ SOLN
10.0000 mg | Freq: Once | INTRAMUSCULAR | Status: AC
  Administered 2021-06-12: 10 mg via INTRAMUSCULAR

## 2021-06-12 NOTE — Discharge Instructions (Addendum)
Discontinue Benadryl recommend over-the-counter Claritin 12-hour formula following take twice daily until rash resolves.  Apply triamcinolone cream to areas in which the rash is present however avoid applying anywhere near the eyes.  You received a Decadron injection here in clinic today this is a steroid which will stop the reaction from spreading.   Start doxycycline 100 mg twice daily for 5 days for the skin that appears to be infected behind your left ear.

## 2021-06-12 NOTE — ED Provider Notes (Signed)
[facility]    CSN: [id number] Arrival date & time: 06/12/21  1308      History   Chief Complaint Chief Complaint  Patient presents with   Rash    HPI Gabriella Dalton is a 45 y.o. female.   HPI Patient presents today for evaluation of a rash which initially started on the left side of her face and has spread to include her neck upper chest wall breast and arms.  She denies any known contact with any outdoor irritant.  Reports a history of solar dermatitis however has not had any prolonged sun exposure.  She has 2 small kittens at home.  No other outdoor animals.  No known contact with any outdoor plants or poison ivy.  She reports the rash started itching today she has been taking Benadryl consistently over the last 3 days.  Denies any irritation within the oral cavity.  Past Medical History:  Diagnosis Date   AMA (advanced maternal age) multigravida 35+    Bartholin cyst    History of gallstones 2015   Pyloric stenosis, congenital    as an infant    Patient Active Problem List   Diagnosis Date Noted   Indication for care in labor or delivery 01/02/2015   SVD (spontaneous vaginal delivery) 01/02/2015   Low back pain 08/06/2013    Past Surgical History:  Procedure Laterality Date   BARTHOLIN CYST MARSUPIALIZATION N/A 10/23/2018   Procedure: BARTHOLIN CYST MARSUPIALIZATION and excision;  Surgeon: Zelphia Cairo, MD;  Location: [facility];  Service: Gynecology;  Laterality: N/A;  need bovie   CHOLECYSTECTOMY N/A 05/19/2014   Procedure: LAPAROSCOPIC CHOLECYSTECTOMY;  Surgeon: Dalia Heading, MD;  Location: [facility];  Service: General;  Laterality: N/A;   PYLOROMYOTOMY     pyloric stenosis as an infant    OB History     Gravida  2   Para  2   Term  2   Preterm      AB      Living  2      SAB      IAB      Ectopic      Multiple  0   Live Births  2            Home Medications    Prior to Admission medications    Medication Sig Start Date End Date Taking? Authorizing Provider  doxycycline (VIBRAMYCIN) 100 MG capsule Take 1 capsule (100 mg total) by mouth 2 (two) times daily for 5 days. 06/12/21 06/17/21 Yes Bing Neighbors, FNP  triamcinolone cream (KENALOG) 0.1 % Apply 1 application topically 2 (two) times daily. 06/12/21  Yes Bing Neighbors, FNP  ibuprofen (ADVIL,MOTRIN) 600 MG tablet Take 1 tablet (600 mg total) by mouth every 6 (six) hours as needed. 10/23/18   Zelphia Cairo, MD  oxyCODONE-acetaminophen (PERCOCET) 5-325 MG tablet Take 1-2 tablets by mouth every 4 (four) hours as needed for severe pain. 10/23/18   Zelphia Cairo, MD    Family History Family History  Problem Relation Age of Onset   Hypertension Mother    Thyroid disease Mother    Diabetes Mother    Cancer Father        prostate and lymphoma    Social History Social History   Tobacco Use   Smoking status: Never   Smokeless tobacco: Never  Vaping Use   Vaping Use: Never used  Substance Use Topics   Alcohol use: No   Drug  use: No     Allergies   Patient has no known allergies.   Review of Systems Review of Systems Pertinent negatives listed in HPI  Physical Exam Triage Vital Signs ED Triage Vitals  Enc Vitals Group     BP 06/12/21 1449 (!) 137/92     Pulse Rate 06/12/21 1449 96     Resp 06/12/21 1449 16     Temp 06/12/21 1449 (!) 97.3 F (36.3 C)     Temp Source 06/12/21 1449 Oral     SpO2 06/12/21 1449 97 %     Weight --      Height --      Head Circumference --      Peak Flow --      Pain Score 06/12/21 1500 0     Pain Loc --      Pain Edu? --      Excl. in GC? --    No data found.  Updated Vital Signs BP (!) 137/92 (BP Location: Right Arm)   Pulse 96   Temp (!) 97.3 F (36.3 C) (Oral)   Resp 16   SpO2 97%   Breastfeeding No   Visual Acuity Right Eye Distance:   Left Eye Distance:   Bilateral Distance:    Right Eye Near:   Left Eye Near:    Bilateral Near:     Physical  Exam HENT:     Head: Normocephalic.  Cardiovascular:     Rate and Rhythm: Normal rate and regular rhythm.  Pulmonary:     Effort: Pulmonary effort is normal.  Skin:    Capillary Refill: Capillary refill takes less than 2 seconds.     Findings: Rash present. Rash is macular, papular and vesicular.  Neurological:     Mental Status: She is alert.     GCS: GCS eye subscore is 4. GCS verbal subscore is 5. GCS motor subscore is 6.  Psychiatric:        Attention and Perception: Attention normal.        Mood and Affect: Mood normal.        Speech: Speech normal.     UC Treatments / Results  Labs (all labs ordered are listed, but only abnormal results are displayed) Labs Reviewed - No data to display  EKG   Radiology No results found.  Procedures Procedures (including critical care time)  Medications Ordered in UC Medications  dexamethasone (DECADRON) injection 10 mg (has no administration in time range)    Initial Impression / Assessment and Plan / UC Course  I have reviewed the triage vital signs and the nursing notes.  Pertinent labs & imaging results that were available during my care of the patient were reviewed by me and considered in my medical decision making (see chart for details).    Irritant contact dermatitis treatment with Decadron given here in clinic.  Patient will continue management at home with Claritin and topical steroid cream. Patient has a secondary skin infection localized to the left ear treatment with doxycycline 100 mg twice daily for 5 days.  Return precautions if symptoms worsen or do not improve.  Final Clinical Impressions(s) / UC Diagnoses   Final diagnoses:  Irritant contact dermatitis, unspecified trigger  Localized bacterial skin infection     Discharge Instructions      Discontinue Benadryl recommend over-the-counter Claritin 12-hour formula following take twice daily until rash resolves.  Apply triamcinolone cream to areas in  which the rash is present however avoid  applying anywhere near the eyes.  You received a Decadron injection here in clinic today this is a steroid which will stop the reaction from spreading.   Start doxycycline 100 mg twice daily for 5 days for the skin that appears to be infected behind your left ear.     ED Prescriptions     Medication Sig Dispense Auth. Provider   doxycycline (VIBRAMYCIN) 100 MG capsule Take 1 capsule (100 mg total) by mouth 2 (two) times daily for 5 days. 10 capsule Bing Neighbors, FNP   triamcinolone cream (KENALOG) 0.1 % Apply 1 application topically 2 (two) times daily. 454 g Bing Neighbors, FNP      PDMP not reviewed this encounter.   Bing Neighbors, FNP 06/12/21 [phone]

## 2021-06-12 NOTE — ED Triage Notes (Addendum)
Pt presents with rash on her upper chest, neck, shoulders and face that started Friday evening. Reports the areas are not itchy. States it has progressively gotten worse. Pts left neck is slightly swollen. Pt also has one bump on her left forearm.

## 2022-02-22 DIAGNOSIS — L811 Chloasma: Secondary | ICD-10-CM | POA: Diagnosis not present

## 2022-02-22 DIAGNOSIS — Z6829 Body mass index (BMI) 29.0-29.9, adult: Secondary | ICD-10-CM | POA: Diagnosis not present

## 2022-02-22 DIAGNOSIS — E6609 Other obesity due to excess calories: Secondary | ICD-10-CM | POA: Diagnosis not present

## 2022-02-22 DIAGNOSIS — Z0001 Encounter for general adult medical examination with abnormal findings: Secondary | ICD-10-CM | POA: Diagnosis not present

## 2022-02-22 DIAGNOSIS — Z1331 Encounter for screening for depression: Secondary | ICD-10-CM | POA: Diagnosis not present

## 2022-06-01 DIAGNOSIS — Z124 Encounter for screening for malignant neoplasm of cervix: Secondary | ICD-10-CM | POA: Diagnosis not present

## 2022-06-01 DIAGNOSIS — Z0000 Encounter for general adult medical examination without abnormal findings: Secondary | ICD-10-CM | POA: Diagnosis not present

## 2022-06-01 DIAGNOSIS — Z1231 Encounter for screening mammogram for malignant neoplasm of breast: Secondary | ICD-10-CM | POA: Diagnosis not present

## 2022-06-01 DIAGNOSIS — Z1151 Encounter for screening for human papillomavirus (HPV): Secondary | ICD-10-CM | POA: Diagnosis not present

## 2022-06-01 DIAGNOSIS — Z6832 Body mass index (BMI) 32.0-32.9, adult: Secondary | ICD-10-CM | POA: Diagnosis not present

## 2022-06-01 DIAGNOSIS — Z01419 Encounter for gynecological examination (general) (routine) without abnormal findings: Secondary | ICD-10-CM | POA: Diagnosis not present

## 2022-06-06 ENCOUNTER — Other Ambulatory Visit: Payer: Self-pay | Admitting: Obstetrics and Gynecology

## 2022-06-06 DIAGNOSIS — R928 Other abnormal and inconclusive findings on diagnostic imaging of breast: Secondary | ICD-10-CM

## 2022-06-08 ENCOUNTER — Ambulatory Visit
Admission: RE | Admit: 2022-06-08 | Discharge: 2022-06-08 | Disposition: A | Discharge status: Home / Self Care | Payer: BC Managed Care – PPO | Source: Ambulatory Visit | Attending: Obstetrics and Gynecology | Admitting: Obstetrics and Gynecology

## 2022-06-08 ENCOUNTER — Ambulatory Visit
Admission: RE | Admit: 2022-06-08 | Discharge: 2022-06-08 | Disposition: A | Discharge status: Home / Self Care | Payer: Managed Care, Other (non HMO) | Source: Ambulatory Visit | Attending: Obstetrics and Gynecology | Admitting: Obstetrics and Gynecology

## 2022-06-08 DIAGNOSIS — R928 Other abnormal and inconclusive findings on diagnostic imaging of breast: Secondary | ICD-10-CM | POA: Diagnosis not present

## 2022-06-08 DIAGNOSIS — N6001 Solitary cyst of right breast: Secondary | ICD-10-CM | POA: Diagnosis not present

## 2022-06-27 DIAGNOSIS — R87610 Atypical squamous cells of undetermined significance on cytologic smear of cervix (ASC-US): Secondary | ICD-10-CM | POA: Diagnosis not present

## 2022-06-27 DIAGNOSIS — Z3202 Encounter for pregnancy test, result negative: Secondary | ICD-10-CM | POA: Diagnosis not present

## 2022-06-27 DIAGNOSIS — N870 Mild cervical dysplasia: Secondary | ICD-10-CM | POA: Diagnosis not present

## 2022-07-27 DIAGNOSIS — Z6833 Body mass index (BMI) 33.0-33.9, adult: Secondary | ICD-10-CM | POA: Diagnosis not present

## 2022-07-27 DIAGNOSIS — M25561 Pain in right knee: Secondary | ICD-10-CM | POA: Diagnosis not present

## 2022-07-27 DIAGNOSIS — E6609 Other obesity due to excess calories: Secondary | ICD-10-CM | POA: Diagnosis not present

## 2022-08-02 DIAGNOSIS — M25561 Pain in right knee: Secondary | ICD-10-CM | POA: Diagnosis not present

## 2022-09-11 DIAGNOSIS — D539 Nutritional anemia, unspecified: Secondary | ICD-10-CM | POA: Diagnosis not present

## 2022-09-11 DIAGNOSIS — R5383 Other fatigue: Secondary | ICD-10-CM | POA: Diagnosis not present

## 2022-09-11 DIAGNOSIS — E669 Obesity, unspecified: Secondary | ICD-10-CM | POA: Diagnosis not present

## 2022-09-11 DIAGNOSIS — E8881 Metabolic syndrome: Secondary | ICD-10-CM | POA: Diagnosis not present

## 2022-09-11 DIAGNOSIS — R0602 Shortness of breath: Secondary | ICD-10-CM | POA: Diagnosis not present

## 2022-09-11 DIAGNOSIS — N914 Secondary oligomenorrhea: Secondary | ICD-10-CM | POA: Diagnosis not present

## 2022-09-11 DIAGNOSIS — Z1331 Encounter for screening for depression: Secondary | ICD-10-CM | POA: Diagnosis not present

## 2022-09-11 DIAGNOSIS — E559 Vitamin D deficiency, unspecified: Secondary | ICD-10-CM | POA: Diagnosis not present

## 2022-09-11 DIAGNOSIS — E7800 Pure hypercholesterolemia, unspecified: Secondary | ICD-10-CM | POA: Diagnosis not present

## 2022-09-11 DIAGNOSIS — Z79899 Other long term (current) drug therapy: Secondary | ICD-10-CM | POA: Diagnosis not present

## 2022-10-12 DIAGNOSIS — R768 Other specified abnormal immunological findings in serum: Secondary | ICD-10-CM | POA: Diagnosis not present

## 2022-10-12 DIAGNOSIS — E669 Obesity, unspecified: Secondary | ICD-10-CM | POA: Diagnosis not present

## 2022-10-12 DIAGNOSIS — N914 Secondary oligomenorrhea: Secondary | ICD-10-CM | POA: Diagnosis not present

## 2022-10-12 DIAGNOSIS — R635 Abnormal weight gain: Secondary | ICD-10-CM | POA: Diagnosis not present

## 2022-10-12 DIAGNOSIS — Z5320 Procedure and treatment not carried out because of patient's decision for unspecified reasons: Secondary | ICD-10-CM | POA: Diagnosis not present

## 2023-01-15 DIAGNOSIS — J069 Acute upper respiratory infection, unspecified: Secondary | ICD-10-CM | POA: Diagnosis not present

## 2023-02-28 DIAGNOSIS — Z1331 Encounter for screening for depression: Secondary | ICD-10-CM | POA: Diagnosis not present

## 2023-02-28 DIAGNOSIS — Z0001 Encounter for general adult medical examination with abnormal findings: Secondary | ICD-10-CM | POA: Diagnosis not present

## 2023-02-28 DIAGNOSIS — Z6832 Body mass index (BMI) 32.0-32.9, adult: Secondary | ICD-10-CM | POA: Diagnosis not present

## 2023-02-28 DIAGNOSIS — E6609 Other obesity due to excess calories: Secondary | ICD-10-CM | POA: Diagnosis not present

## 2023-04-19 DIAGNOSIS — Z6832 Body mass index (BMI) 32.0-32.9, adult: Secondary | ICD-10-CM | POA: Diagnosis not present

## 2023-04-19 DIAGNOSIS — E6609 Other obesity due to excess calories: Secondary | ICD-10-CM | POA: Diagnosis not present

## 2023-04-19 DIAGNOSIS — S2001XA Contusion of right breast, initial encounter: Secondary | ICD-10-CM | POA: Diagnosis not present

## 2023-04-19 DIAGNOSIS — N644 Mastodynia: Secondary | ICD-10-CM | POA: Diagnosis not present

## 2023-06-04 DIAGNOSIS — Z1231 Encounter for screening mammogram for malignant neoplasm of breast: Secondary | ICD-10-CM | POA: Diagnosis not present

## 2023-06-04 DIAGNOSIS — Z6834 Body mass index (BMI) 34.0-34.9, adult: Secondary | ICD-10-CM | POA: Diagnosis not present

## 2023-06-04 DIAGNOSIS — Z124 Encounter for screening for malignant neoplasm of cervix: Secondary | ICD-10-CM | POA: Diagnosis not present

## 2023-06-04 DIAGNOSIS — Z1151 Encounter for screening for human papillomavirus (HPV): Secondary | ICD-10-CM | POA: Diagnosis not present

## 2023-06-04 DIAGNOSIS — Z01419 Encounter for gynecological examination (general) (routine) without abnormal findings: Secondary | ICD-10-CM | POA: Diagnosis not present

## 2023-06-10 ENCOUNTER — Other Ambulatory Visit: Payer: Self-pay | Admitting: Obstetrics and Gynecology

## 2023-06-10 DIAGNOSIS — R928 Other abnormal and inconclusive findings on diagnostic imaging of breast: Secondary | ICD-10-CM

## 2023-06-11 ENCOUNTER — Encounter (INDEPENDENT_AMBULATORY_CARE_PROVIDER_SITE_OTHER): Payer: Self-pay | Admitting: *Deleted

## 2023-06-13 ENCOUNTER — Ambulatory Visit
Admission: RE | Admit: 2023-06-13 | Discharge: 2023-06-13 | Disposition: A | Discharge status: Home / Self Care | Payer: BC Managed Care – PPO | Source: Ambulatory Visit | Attending: Obstetrics and Gynecology | Admitting: Obstetrics and Gynecology

## 2023-06-13 DIAGNOSIS — R928 Other abnormal and inconclusive findings on diagnostic imaging of breast: Secondary | ICD-10-CM

## 2023-06-13 DIAGNOSIS — N6002 Solitary cyst of left breast: Secondary | ICD-10-CM | POA: Diagnosis not present

## 2023-08-23 ENCOUNTER — Telehealth (INDEPENDENT_AMBULATORY_CARE_PROVIDER_SITE_OTHER): Payer: Self-pay | Admitting: *Deleted

## 2023-08-23 NOTE — Telephone Encounter (Signed)
Room 1 Thanks 

## 2023-08-23 NOTE — Telephone Encounter (Signed)
Referring MD/PCP: Terie Purser  Insurance: Ezequiel Essex [id number]  Best Phone Number: [phone]  Reason for the colonoscopy screening  Has patient had this procedure before?  no  If so, when, by whom and where?    Is there a family history of colon cancer?  no  Who?  What age when diagnosed?    Is patient diabetic? If yes, Type 1 or Type 2   no      Does patient have prosthetic heart valve or mechanical valve?  no  Do you have a pacemaker/defibrillator?  no  Has patient ever had endocarditis/atrial fibrillation? no  Does patient use oxygen? no  Has patient had joint replacement within last 12 months?  no  Is patient constipated or do they take laxatives? no  Does patient have a history of alcohol/drug use?  no  Have you had a stroke/heart attack last 6 mths? no  Do you take medicine for weight loss?  no  For female patients,: have you had a hysterectomy no                      are you post menopausal no                      do you still have your menstrual cycle no  Do you take any blood-thinning medications such as: (aspirin, warfarin, Plavix, Aggrenox)  no  If yes we need the name, milligram, dosage and who is prescribing doctor   Medications: didn't list any medications  Allergies: NKDA  Pharmacy: [facility]

## 2023-08-26 ENCOUNTER — Encounter (INDEPENDENT_AMBULATORY_CARE_PROVIDER_SITE_OTHER): Payer: Self-pay | Admitting: *Deleted

## 2023-08-26 MED ORDER — PEG 3350-KCL-NA BICARB-NACL 420 G PO SOLR: 4000.0000 mL | Freq: Once | ORAL | 0 refills | Status: AC

## 2023-08-26 NOTE — Addendum Note (Signed)
Addended by: Marlowe Shores on: 08/26/2023 10:45 AM   Modules accepted: Orders

## 2023-08-26 NOTE — Telephone Encounter (Signed)
Referral completed

## 2023-08-26 NOTE — Telephone Encounter (Signed)
Pt contacted and scheduled for 09/13/23 at 2:15pm with Dr.Castaneda. Prep sent to pharmacy. Instructions mailed to patient. No PA needed per insurance.

## 2023-09-10 DIAGNOSIS — B351 Tinea unguium: Secondary | ICD-10-CM | POA: Diagnosis not present

## 2023-09-13 ENCOUNTER — Other Ambulatory Visit: Payer: Self-pay

## 2023-09-13 ENCOUNTER — Ambulatory Visit (HOSPITAL_COMMUNITY)
Admission: RE | Admit: 2023-09-13 | Discharge: 2023-09-13 | Disposition: A | Discharge status: Home / Self Care | Payer: BC Managed Care – PPO | Source: Ambulatory Visit | Attending: Gastroenterology | Admitting: Gastroenterology

## 2023-09-13 ENCOUNTER — Encounter (HOSPITAL_COMMUNITY): Payer: Self-pay | Admitting: Gastroenterology

## 2023-09-13 ENCOUNTER — Ambulatory Visit (HOSPITAL_COMMUNITY): Payer: BC Managed Care – PPO | Admitting: Certified Registered Nurse Anesthetist

## 2023-09-13 ENCOUNTER — Encounter (HOSPITAL_COMMUNITY)
Admission: RE | Disposition: A | Discharge status: Home / Self Care | Payer: Self-pay | Source: Ambulatory Visit | Attending: Gastroenterology

## 2023-09-13 DIAGNOSIS — K5730 Diverticulosis of large intestine without perforation or abscess without bleeding: Secondary | ICD-10-CM | POA: Diagnosis not present

## 2023-09-13 DIAGNOSIS — Z6836 Body mass index (BMI) 36.0-36.9, adult: Secondary | ICD-10-CM | POA: Diagnosis not present

## 2023-09-13 DIAGNOSIS — Z1211 Encounter for screening for malignant neoplasm of colon: Secondary | ICD-10-CM

## 2023-09-13 DIAGNOSIS — Z01818 Encounter for other preprocedural examination: Secondary | ICD-10-CM

## 2023-09-13 DIAGNOSIS — K648 Other hemorrhoids: Secondary | ICD-10-CM | POA: Diagnosis not present

## 2023-09-13 HISTORY — PX: COLONOSCOPY WITH PROPOFOL: SHX5780

## 2023-09-13 LAB — HM COLONOSCOPY

## 2023-09-13 LAB — POCT PREGNANCY, URINE: Preg Test, Ur: NEGATIVE

## 2023-09-13 SURGERY — COLONOSCOPY WITH PROPOFOL
Anesthesia: General

## 2023-09-13 MED ORDER — PROPOFOL 500 MG/50ML IV EMUL
INTRAVENOUS | Status: DC | PRN
  Administered 2023-09-13: 150 ug/kg/min via INTRAVENOUS

## 2023-09-13 MED ORDER — PROPOFOL 10 MG/ML IV BOLUS
INTRAVENOUS | Status: DC | PRN
Start: 2023-09-13 — End: 2023-09-13
  Administered 2023-09-13: 60 mg via INTRAVENOUS

## 2023-09-13 MED ORDER — PHENYLEPHRINE HCL (PRESSORS) 10 MG/ML IV SOLN
INTRAVENOUS | Status: DC | PRN
Start: 2023-09-13 — End: 2023-09-13
  Administered 2023-09-13 (×4): 80 ug via INTRAVENOUS

## 2023-09-13 MED ORDER — LACTATED RINGERS IV SOLN: INTRAVENOUS | Status: DC

## 2023-09-13 MED ORDER — PHENYLEPHRINE 80 MCG/ML (10ML) SYRINGE FOR IV PUSH (FOR BLOOD PRESSURE SUPPORT)
PREFILLED_SYRINGE | INTRAVENOUS | Status: AC
  Filled 2023-09-13: qty 10

## 2023-09-13 MED ORDER — LIDOCAINE HCL (PF) 2 % IJ SOLN
INTRAMUSCULAR | Status: AC
  Filled 2023-09-13: qty 5

## 2023-09-13 MED ORDER — PROPOFOL 500 MG/50ML IV EMUL
INTRAVENOUS | Status: AC
  Filled 2023-09-13: qty 50

## 2023-09-13 MED ORDER — LACTATED RINGERS IV SOLN
INTRAVENOUS | Status: DC | PRN
Start: 2023-09-13 — End: 2023-09-13

## 2023-09-13 NOTE — H&P (Signed)
Gabriella Dalton is an 47 y.o. female.   Chief Complaint: colorectal cancer screening HPI: 47 y/o  with no PMH, coming for screening colonoscopy. The patient has never had a colonoscopy in the past.  The patient denies having any complaints such as melena, hematochezia, abdominal pain or distention, change in her bowel movement consistency or frequency, no changes in weight recently.  No family history of colorectal cancer.   Past Medical History:  Diagnosis Date   AMA (advanced maternal age) multigravida 35+    Bartholin cyst    History of gallstones 2015   Pyloric stenosis, congenital    as an infant    Past Surgical History:  Procedure Laterality Date   BARTHOLIN CYST MARSUPIALIZATION N/A 10/23/2018   Procedure: BARTHOLIN CYST MARSUPIALIZATION and excision;  Surgeon: Zelphia Cairo, MD;  Location[facility];  Service: Gynecology;  Laterality: N/A;  need bovie   CHOLECYSTECTOMY N/A 05/19/2014   Procedure: LAPAROSCOPIC CHOLECYSTECTOMY;  Surgeon: Dalia Heading, MD;  Location: [facility];  Service: General;  Laterality: N/A;   PYLOROMYOTOMY     pyloric stenosis as an infant    Family History  Problem Relation Age of Onset   Hypertension Mother    Thyroid disease Mother    Diabetes Mother    Cancer Father        prostate and lymphoma   Social History:  reports that she has never smoked. She has never used smokeless tobacco. She reports that she does not drink alcohol and does not use drugs.  Allergies: No Known Allergies  Medications Prior to Admission  Medication Sig Dispense Refill   ibuprofen (ADVIL,MOTRIN) 600 MG tablet Take 1 tablet (600 mg total) by mouth every 6 (six) hours as needed. 30 tablet 0   oxyCODONE-acetaminophen (PERCOCET) 5-325 MG tablet Take 1-2 tablets by mouth every 4 (four) hours as needed for severe pain. 20 tablet 0   triamcinolone cream (KENALOG) 0.1 % Apply 1 application topically 2 (two) times daily. 454 g 0    Results for orders placed  or performed during the hospital encounter of 09/13/23 (from the past 48 hour(s))  Pregnancy, urine POC     Status: None   Collection Time: 09/13/23  7:03 AM  Result Value Ref Range   Preg Test, Ur NEGATIVE NEGATIVE    Comment:        THE SENSITIVITY OF THIS METHODOLOGY IS >24 mIU/mL    No results found.  Review of Systems  All other systems reviewed and are negative.   Blood pressure 106/72, pulse 77, temperature 98.3 F (36.8 C), temperature source Oral, resp. rate 11, height 5\' 2"  (1.575 m), weight 89.8 kg, SpO2 96%. Physical Exam  GENERAL: The patient is AO x3, in no acute distress. HEENT: Head is normocephalic and atraumatic. EOMI are intact. Mouth is well hydrated and without lesions. NECK: Supple. No masses LUNGS: Clear to auscultation. No presence of rhonchi/wheezing/rales. Adequate chest expansion HEART: RRR, normal s1 and s2. ABDOMEN: Soft, nontender, no guarding, no peritoneal signs, and nondistended. BS +. No masses. EXTREMITIES: Without any cyanosis, clubbing, rash, lesions or edema. NEUROLOGIC: AOx3, no focal motor deficit. SKIN: no jaundice, no rashes  Assessment/Plan  47 y/o  with no PMH, coming for screening colonoscopy. The patient is at average risk for colorectal cancer.  We will proceed with colonoscopy today.   Gabriella Frame, MD 09/13/2023, 7:23 AM

## 2023-09-13 NOTE — Op Note (Addendum)
[facility] Patient Name: Gabriella Dalton Procedure Date: 09/13/2023 7:06 AM MRN: [id number] Date of Birth: [birth year] Attending MD: Katrinka Blazing , , [id number] CSN: [id number] Age: 47 Admit Type: Outpatient Procedure:                Colonoscopy Indications:              Screening for colorectal malignant neoplasm Providers:                Katrinka Blazing, Nena Polio, RN, Elinor Parkinson Referring MD:              Medicines:                Monitored Anesthesia Care Complications:            No immediate complications. Estimated Blood Loss:     Estimated blood loss: none. Procedure:                Pre-Anesthesia Assessment:                           - Prior to the procedure, a History and Physical                            was performed, and patient medications, allergies                            and sensitivities were reviewed. The patient's                            tolerance of previous anesthesia was reviewed.                           - The risks and benefits of the procedure and the                            sedation options and risks were discussed with the                            patient. All questions were answered and informed                            consent was obtained.                           - ASA Grade Assessment: II - A patient with mild                            systemic disease.                           After obtaining informed consent, the colonoscope                            was passed under direct vision. Throughout the                            procedure, the patient's blood  pressure, pulse, and                            oxygen saturations were monitored continuously. The                            [id number]-[id number] ([id number]) scope was introduced through                            the anus and advanced to the the cecum, identified                            by appendiceal orifice and ileocecal valve. The                             colonoscopy was performed without difficulty. The                            patient tolerated the procedure well. The quality                            of the bowel preparation was good. Scope In: 7:36:04 AM Scope Out: 7:53:09 AM Scope Withdrawal Time: 0 hours 11 minutes 40 seconds  Total Procedure Duration: 0 hours 17 minutes 5 seconds  Findings:      The perianal and digital rectal examinations were normal.      Scattered medium-mouthed and small-mouthed diverticula were found in the       sigmoid colon, descending colon and transverse colon.      Non-bleeding internal hemorrhoids were found during retroflexion. The       hemorrhoids were small. Impression:               - Diverticulosis in the sigmoid colon, in the                            descending colon and in the transverse colon.                           - Non-bleeding internal hemorrhoids.                           - No specimens collected. Moderate Sedation:      Per Anesthesia Care Recommendation:           - Discharge patient to home (ambulatory).                           - Resume previous diet.                           - Repeat colonoscopy in 10 years for screening                            purposes. Procedure Code(s):        --- Professional ---  Z6109, Colorectal cancer screening; colonoscopy on                            individual not meeting criteria for high risk Diagnosis Code(s):        --- Professional ---                           Z12.11, Encounter for screening for malignant                            neoplasm of colon                           K64.8, Other hemorrhoids                           K57.30, Diverticulosis of large intestine without                            perforation or abscess without bleeding CPT copyright 2022 American Medical Association. All rights reserved. The codes documented in this report are preliminary and upon coder review may  be revised to meet  current compliance requirements. Katrinka Blazing, MD Katrinka Blazing,  09/13/2023 7:57:46 AM This report has been signed electronically. Number of Addenda: 0

## 2023-09-13 NOTE — Anesthesia Preprocedure Evaluation (Signed)
Anesthesia Evaluation  Patient identified by MRN, date of birth, ID band Patient awake    Reviewed: Allergy & Precautions, H&P , NPO status , Patient's Chart, lab work & pertinent test results, reviewed documented beta blocker date and time   Airway Mallampati: II  TM Distance: >3 FB Neck ROM: full    Dental no notable dental hx.    Pulmonary neg pulmonary ROS   Pulmonary exam normal breath sounds clear to auscultation       Cardiovascular Exercise Tolerance: Good negative cardio ROS  Rhythm:regular Rate:Normal     Neuro/Psych negative neurological ROS  negative psych ROS   GI/Hepatic negative GI ROS, Neg liver ROS,,,  Endo/Other    Morbid obesity  Renal/GU negative Renal ROS  negative genitourinary   Musculoskeletal   Abdominal   Peds  Hematology negative hematology ROS (+)   Anesthesia Other Findings   Reproductive/Obstetrics negative OB ROS                             Anesthesia Physical Anesthesia Plan  ASA: 2  Anesthesia Plan: General   Post-op Pain Management:    Induction:   PONV Risk Score and Plan: Propofol infusion  Airway Management Planned:   Additional Equipment:   Intra-op Plan:   Post-operative Plan:   Informed Consent: I have reviewed the patients History and Physical, chart, labs and discussed the procedure including the risks, benefits and alternatives for the proposed anesthesia with the patient or authorized representative who has indicated his/her understanding and acceptance.     Dental Advisory Given  Plan Discussed with: CRNA  Anesthesia Plan Comments:        Anesthesia Quick Evaluation

## 2023-09-13 NOTE — Transfer of Care (Signed)
Immediate Anesthesia Transfer of Care Note  Patient: Gabriella Dalton  Procedure(s) Performed: COLONOSCOPY WITH PROPOFOL  Patient Location: Endoscopy Unit  Anesthesia Type:General  Level of Consciousness: awake, alert , and oriented  Airway & Oxygen Therapy: Patient Spontanous Breathing  Post-op Assessment: Report given to RN, Post -op Vital signs reviewed and stable, Patient moving all extremities X 4, and Patient able to stick tongue midline  Post vital signs: Reviewed and stable  Last Vitals:  Vitals Value Taken Time  BP 97/53 09/13/23 0759  Temp 36.6 C 09/13/23 0759  Pulse 82 09/13/23 0759  Resp 20 09/13/23 0759  SpO2 97 % 09/13/23 0759    Last Pain:  Vitals:   09/13/23 0759  TempSrc: Oral  PainSc:       Patients Stated Pain Goal: 9 (09/13/23 0708)  Complications: No notable events documented.

## 2023-09-13 NOTE — Discharge Instructions (Signed)
You are being discharged to home.  Resume your previous diet.  Your physician has recommended a repeat colonoscopy in 10 years for screening purposes.  

## 2023-09-13 NOTE — Anesthesia Postprocedure Evaluation (Signed)
Anesthesia Post Note  Patient: Secondary school teacher  Procedure(s) Performed: COLONOSCOPY WITH PROPOFOL  Patient location during evaluation: Phase II Anesthesia Type: General Level of consciousness: awake Pain management: pain level controlled Vital Signs Assessment: post-procedure vital signs reviewed and stable Respiratory status: spontaneous breathing and respiratory function stable Cardiovascular status: blood pressure returned to baseline and stable Postop Assessment: no headache and no apparent nausea or vomiting Anesthetic complications: no Comments: Late entry   No notable events documented.   Last Vitals:  Vitals:   09/13/23 0708 09/13/23 0759  BP: 106/72 (!) 97/53  Pulse: 77 82  Resp: 11 20  Temp: 36.8 C 36.6 C  SpO2: 96% 97%    Last Pain:  Vitals:   09/13/23 0759  TempSrc: Oral  PainSc: 0-No pain                 Windell Norfolk

## 2023-09-16 ENCOUNTER — Encounter (INDEPENDENT_AMBULATORY_CARE_PROVIDER_SITE_OTHER): Payer: Self-pay | Admitting: *Deleted

## 2023-09-20 ENCOUNTER — Encounter (HOSPITAL_COMMUNITY): Payer: Self-pay | Admitting: Gastroenterology

## 2024-04-09 DIAGNOSIS — E6609 Other obesity due to excess calories: Secondary | ICD-10-CM | POA: Diagnosis not present

## 2024-04-09 DIAGNOSIS — E559 Vitamin D deficiency, unspecified: Secondary | ICD-10-CM | POA: Diagnosis not present

## 2024-04-09 DIAGNOSIS — Z6838 Body mass index (BMI) 38.0-38.9, adult: Secondary | ICD-10-CM | POA: Diagnosis not present

## 2024-04-09 DIAGNOSIS — Z1331 Encounter for screening for depression: Secondary | ICD-10-CM | POA: Diagnosis not present

## 2024-04-09 DIAGNOSIS — Z0001 Encounter for general adult medical examination with abnormal findings: Secondary | ICD-10-CM | POA: Diagnosis not present

## 2024-04-22 DIAGNOSIS — Z3009 Encounter for other general counseling and advice on contraception: Secondary | ICD-10-CM | POA: Diagnosis not present

## 2024-04-23 DIAGNOSIS — B351 Tinea unguium: Secondary | ICD-10-CM | POA: Diagnosis not present

## 2024-06-16 DIAGNOSIS — Z1151 Encounter for screening for human papillomavirus (HPV): Secondary | ICD-10-CM | POA: Diagnosis not present

## 2024-06-16 DIAGNOSIS — Z6837 Body mass index (BMI) 37.0-37.9, adult: Secondary | ICD-10-CM | POA: Diagnosis not present

## 2024-06-16 DIAGNOSIS — Z01419 Encounter for gynecological examination (general) (routine) without abnormal findings: Secondary | ICD-10-CM | POA: Diagnosis not present

## 2024-06-16 DIAGNOSIS — Z124 Encounter for screening for malignant neoplasm of cervix: Secondary | ICD-10-CM | POA: Diagnosis not present

## 2024-06-16 DIAGNOSIS — R5383 Other fatigue: Secondary | ICD-10-CM | POA: Diagnosis not present

## 2024-06-16 DIAGNOSIS — Z1231 Encounter for screening mammogram for malignant neoplasm of breast: Secondary | ICD-10-CM | POA: Diagnosis not present

## 2024-09-02 DIAGNOSIS — Z6838 Body mass index (BMI) 38.0-38.9, adult: Secondary | ICD-10-CM | POA: Diagnosis not present

## 2024-09-02 DIAGNOSIS — R030 Elevated blood-pressure reading, without diagnosis of hypertension: Secondary | ICD-10-CM | POA: Diagnosis not present

## 2024-09-02 DIAGNOSIS — J00 Acute nasopharyngitis [common cold]: Secondary | ICD-10-CM | POA: Diagnosis not present

## 2024-09-30 ENCOUNTER — Encounter (INDEPENDENT_AMBULATORY_CARE_PROVIDER_SITE_OTHER): Payer: Self-pay | Admitting: Gastroenterology
